# Patient Record
Sex: Male | Born: 1985 | Hispanic: No | Marital: Single
Health system: Midwestern US, Academic
[De-identification: ages and names within clinical notes are randomized; demographics above are authoritative.]

---

## 2018-08-04 ENCOUNTER — Encounter: Admit: 2018-08-04 | Discharge: 2018-08-05

## 2018-08-04 ENCOUNTER — Encounter: Admit: 2018-08-04 | Discharge: 2018-08-04

## 2018-08-04 ENCOUNTER — Encounter: Admit: 2018-08-04 | Discharge: 2018-08-04 | Attending: Trauma Surgery | Admitting: Trauma Surgery

## 2018-08-04 ENCOUNTER — Ambulatory Visit
Admit: 2018-08-04 | Discharge: 2018-08-07 | Disposition: A | Discharge status: Rehabilitation Facility | Source: Other Acute Inpatient Hospital | Attending: Trauma Surgery | Admitting: Trauma Surgery

## 2018-08-04 DIAGNOSIS — S72112A Displaced fracture of greater trochanter of left femur, initial encounter for closed fracture: Principal | ICD-10-CM

## 2018-08-04 DIAGNOSIS — T1490XA Injury, unspecified, initial encounter: Secondary | ICD-10-CM

## 2018-08-04 DIAGNOSIS — R000 Tachycardia, unspecified: Secondary | ICD-10-CM

## 2018-08-04 DIAGNOSIS — F17213 Nicotine dependence, cigarettes, with withdrawal: Secondary | ICD-10-CM

## 2018-08-04 DIAGNOSIS — Z7409 Other reduced mobility: Secondary | ICD-10-CM

## 2018-08-04 DIAGNOSIS — I10 Essential (primary) hypertension: Secondary | ICD-10-CM

## 2018-08-04 DIAGNOSIS — D72829 Elevated white blood cell count, unspecified: Secondary | ICD-10-CM

## 2018-08-04 DIAGNOSIS — K929 Disease of digestive system, unspecified: Secondary | ICD-10-CM

## 2018-08-04 DIAGNOSIS — S72362A Displaced segmental fracture of shaft of left femur, initial encounter for closed fracture: Secondary | ICD-10-CM

## 2018-08-04 DIAGNOSIS — D62 Acute posthemorrhagic anemia: Secondary | ICD-10-CM

## 2018-08-04 LAB — CBC
Lab: 12 % (ref >60)
Lab: 181 K/UL (ref >60)
Lab: 3.7 M/UL — ABNORMAL LOW (ref 4.4–5.5)
Lab: 32 pg (ref 26–34)
Lab: 33 % — ABNORMAL LOW (ref 40–50)
Lab: 35 g/dL (ref 32.0–36.0)
Lab: 8.5 FL (ref 7–11)
Lab: 9.7 K/UL (ref 4.5–11.0)
Lab: 12 % (ref 11–15)
Lab: 17 10*3/uL — ABNORMAL HIGH (ref 4.5–11.0)
Lab: 184 10*3/uL (ref 150–400)
Lab: 2.9 M/UL — ABNORMAL LOW (ref 4.4–5.5)
Lab: 27 % — ABNORMAL LOW (ref 40–50)
Lab: 32 pg (ref 26–34)
Lab: 8.9 FL (ref 7–11)
Lab: 9.6 g/dL — ABNORMAL LOW (ref 13.5–16.5)
Lab: 93 FL (ref 80–100)

## 2018-08-04 LAB — PHOSPHORUS: Lab: 3.2 mg/dL (ref 2.0–4.5)

## 2018-08-04 LAB — COVID-19 (SARS-COV-2) PCR

## 2018-08-04 LAB — BASIC METABOLIC PANEL
Lab: 138 MMOL/L (ref 137–147)
Lab: 3.9 MMOL/L (ref 3.5–5.1)

## 2018-08-04 LAB — MAGNESIUM: Lab: 2 mg/dL — ABNORMAL LOW (ref 1.6–2.6)

## 2018-08-04 LAB — HEMOGLOBIN & HEMATOCRIT
Lab: 11 g/dL — ABNORMAL LOW (ref 13.5–16.5)
Lab: 32 % — ABNORMAL LOW (ref 40–50)

## 2018-08-04 MED ORDER — SENNOSIDES-DOCUSATE SODIUM 8.6-50 MG PO TAB
1 | Freq: Two times a day (BID) | ORAL | 0 refills | Status: DC
Start: 2018-08-04 — End: 2018-08-07
  Administered 2018-08-05 – 2018-08-07 (×4): 1 via ORAL

## 2018-08-04 MED ORDER — BUPIVACAINE 0.125% PCA EPIDURAL SYR
EPIDURAL | 0 refills | Status: DC
Start: 2018-08-04 — End: 2018-08-05
  Administered 2018-08-05 (×4): 50.000 mL via EPIDURAL

## 2018-08-04 MED ORDER — ALBUMIN, HUMAN 5 % 500 ML IV SOLP (AN)(OSM)
0 refills | Status: DC
Start: 2018-08-04 — End: 2018-08-04
  Administered 2018-08-04: 19:00:00 via INTRAVENOUS

## 2018-08-04 MED ORDER — ACETAMINOPHEN 325 MG PO TAB
650 mg | ORAL | 0 refills | Status: DC
Start: 2018-08-04 — End: 2018-08-06
  Administered 2018-08-05 – 2018-08-06 (×5): 650 mg via ORAL

## 2018-08-04 MED ORDER — CEFAZOLIN 1 GRAM IJ SOLR
0 refills | Status: DC
Start: 2018-08-04 — End: 2018-08-04
  Administered 2018-08-04 (×2): 2 g via INTRAVENOUS

## 2018-08-04 MED ORDER — SUGAMMADEX 100 MG/ML IV SOLN
INTRAVENOUS | 0 refills | Status: DC
Start: 2018-08-04 — End: 2018-08-04
  Administered 2018-08-04: 23:00:00 200 mg via INTRAVENOUS

## 2018-08-04 MED ORDER — LIDOCAINE (PF) 10 MG/ML (1 %) IJ SOLN
INTRAMUSCULAR | 0 refills | Status: DC
Start: 2018-08-04 — End: 2018-08-05
  Administered 2018-08-04: 3 mL via INTRAMUSCULAR

## 2018-08-04 MED ORDER — VANCOMYCIN 1,000 MG IV SOLR
0 refills | Status: DC
Start: 2018-08-04 — End: 2018-08-04
  Administered 2018-08-04: 22:00:00 1 g via TOPICAL

## 2018-08-04 MED ORDER — HALOPERIDOL LACTATE 5 MG/ML IJ SOLN
1 mg | Freq: Once | INTRAVENOUS | 0 refills | Status: DC | PRN
Start: 2018-08-04 — End: 2018-08-05

## 2018-08-04 MED ORDER — DEXMEDETOMIDINE# 4MCG/ML IV SOLN
0 refills | Status: DC
Start: 2018-08-04 — End: 2018-08-04
  Administered 2018-08-04: 23:00:00 4 ug via INTRAVENOUS
  Administered 2018-08-04 (×2): 8 ug via INTRAVENOUS

## 2018-08-04 MED ORDER — ONDANSETRON HCL (PF) 4 MG/2 ML IJ SOLN
4 mg | Freq: Once | INTRAVENOUS | 0 refills | Status: CP | PRN
Start: 2018-08-04 — End: ?
  Administered 2018-08-04: 23:00:00 4 mg via INTRAVENOUS

## 2018-08-04 MED ORDER — FENTANYL CITRATE (PF) 50 MCG/ML IJ SOLN
25-50 ug | INTRAVENOUS | 0 refills | Status: DC | PRN
Start: 2018-08-04 — End: 2018-08-05
  Administered 2018-08-04 (×2): 50 ug via INTRAVENOUS
  Administered 2018-08-04: 18:00:00 100 ug via INTRAVENOUS
  Administered 2018-08-04 (×3): 50 ug via INTRAVENOUS

## 2018-08-04 MED ORDER — METHOCARBAMOL 750 MG PO TAB
750 mg | Freq: Two times a day (BID) | ORAL | 0 refills | Status: DC
Start: 2018-08-04 — End: 2018-08-05
  Administered 2018-08-05: 13:00:00 750 mg via ORAL

## 2018-08-04 MED ORDER — CEFAZOLIN INJ 1GM IVP
2 g | INTRAVENOUS | 0 refills | Status: CP
Start: 2018-08-04 — End: ?
  Administered 2018-08-05 (×2): 2 g via INTRAVENOUS

## 2018-08-04 MED ORDER — ROCURONIUM 10 MG/ML IV SOLN
INTRAVENOUS | 0 refills | Status: DC
Start: 2018-08-04 — End: 2018-08-04
  Administered 2018-08-04: 20:00:00 10 mg via INTRAVENOUS
  Administered 2018-08-04: 18:00:00 40 mg via INTRAVENOUS
  Administered 2018-08-04: 19:00:00 10 mg via INTRAVENOUS
  Administered 2018-08-04: 20:00:00 30 mg via INTRAVENOUS

## 2018-08-04 MED ORDER — ENOXAPARIN 30 MG/0.3 ML SC SYRG
30 mg | Freq: Two times a day (BID) | SUBCUTANEOUS | 0 refills | Status: DC
Start: 2018-08-04 — End: 2018-08-05

## 2018-08-04 MED ORDER — OXYCODONE 5 MG PO TAB
5-10 mg | ORAL | 0 refills | Status: DC | PRN
Start: 2018-08-04 — End: 2018-08-05
  Administered 2018-08-05: 09:00:00 5 mg via ORAL

## 2018-08-04 MED ORDER — LACTATED RINGERS IV SOLP
1000 mL | INTRAVENOUS | 0 refills | Status: CP
Start: 2018-08-04 — End: ?
  Administered 2018-08-05: 1000 mL via INTRAVENOUS

## 2018-08-04 MED ORDER — KETAMINE 10 MG/ML IJ SOLN
INTRAVENOUS | 0 refills | Status: DC
Start: 2018-08-04 — End: 2018-08-05
  Administered 2018-08-04: 20 mg via INTRAVENOUS

## 2018-08-04 MED ORDER — AUTOLOGOUS BLOOD (CELL SAVER)
0 refills | Status: DC
Start: 2018-08-04 — End: 2018-08-04

## 2018-08-04 MED ORDER — OXYCODONE 5 MG PO TAB
5-10 mg | Freq: Once | ORAL | 0 refills | Status: DC | PRN
Start: 2018-08-04 — End: 2018-08-05

## 2018-08-04 MED ORDER — SUCCINYLCHOLINE CHLORIDE 20 MG/ML IJ SOLN
INTRAVENOUS | 0 refills | Status: DC
Start: 2018-08-04 — End: 2018-08-04
  Administered 2018-08-04: 18:00:00 200 mg via INTRAVENOUS

## 2018-08-04 MED ORDER — DEXAMETHASONE SODIUM PHOSPHATE 4 MG/ML IJ SOLN
INTRAVENOUS | 0 refills | Status: DC
Start: 2018-08-04 — End: 2018-08-04
  Administered 2018-08-04: 18:00:00 4 mg via INTRAVENOUS

## 2018-08-04 MED ORDER — DEXTRAN 70-HYPROMELLOSE (PF) 0.1-0.3 % OP DPET
0 refills | Status: DC
Start: 2018-08-04 — End: 2018-08-04
  Administered 2018-08-04: 18:00:00 2 [drp] via OPHTHALMIC

## 2018-08-04 MED ORDER — POLYETHYLENE GLYCOL 3350 17 GRAM PO PWPK
1 | Freq: Every day | ORAL | 0 refills | Status: DC
Start: 2018-08-04 — End: 2018-08-07
  Administered 2018-08-05 – 2018-08-07 (×3): 17 g via ORAL

## 2018-08-04 MED ORDER — ONDANSETRON HCL (PF) 4 MG/2 ML IJ SOLN
4-8 mg | INTRAVENOUS | 0 refills | Status: DC | PRN
Start: 2018-08-04 — End: 2018-08-07

## 2018-08-04 MED ORDER — HYDROMORPHONE (PF) 2 MG/ML IJ SYRG
.5-1 mg | INTRAVENOUS | 0 refills | Status: DC | PRN
Start: 2018-08-04 — End: 2018-08-05
  Administered 2018-08-04: 19:00:00 1 mg via INTRAVENOUS
  Administered 2018-08-04 (×2): 0.5 mg via INTRAVENOUS
  Administered 2018-08-04: 22:00:00 1 mg via INTRAVENOUS
  Administered 2018-08-04 (×2): 0.5 mg via INTRAVENOUS

## 2018-08-04 MED ORDER — FENTANYL CITRATE (PF) 50 MCG/ML IJ SOLN
25 ug | INTRAVENOUS | 0 refills | Status: DC | PRN
Start: 2018-08-04 — End: 2018-08-05

## 2018-08-04 MED ORDER — PROPOFOL INJ 10 MG/ML IV VIAL
0 refills | Status: DC
Start: 2018-08-04 — End: 2018-08-04
  Administered 2018-08-04: 18:00:00 200 mg via INTRAVENOUS

## 2018-08-04 MED ORDER — LIDOCAINE (PF) 200 MG/10 ML (2 %) IJ SYRG
0 refills | Status: DC
Start: 2018-08-04 — End: 2018-08-04
  Administered 2018-08-04: 18:00:00 100 mg via INTRAVENOUS

## 2018-08-04 MED ORDER — NALOXONE 0.4 MG/ML IJ SOLN
.08 mg | INTRAVENOUS | 0 refills | Status: DC | PRN
Start: 2018-08-04 — End: 2018-08-07

## 2018-08-04 MED ORDER — DIPHENHYDRAMINE HCL 50 MG/ML IJ SOLN
25 mg | Freq: Once | INTRAVENOUS | 0 refills | Status: DC | PRN
Start: 2018-08-04 — End: 2018-08-05

## 2018-08-04 MED ORDER — KETAMINE 10 MG/ML IJ SOLN
0 refills | Status: DC
Start: 2018-08-04 — End: 2018-08-04
  Administered 2018-08-04 (×4): 15 mg via INTRAVENOUS

## 2018-08-04 MED ORDER — MIDAZOLAM 1 MG/ML IJ SOLN
INTRAVENOUS | 0 refills | Status: DC
Start: 2018-08-04 — End: 2018-08-04
  Administered 2018-08-04: 18:00:00 2 mg via INTRAVENOUS

## 2018-08-04 MED ORDER — LACTATED RINGERS IV SOLP
1000 mL | INTRAVENOUS | 0 refills | Status: DC
Start: 2018-08-04 — End: 2018-08-05
  Administered 2018-08-04: 14:00:00 1000.000 mL via INTRAVENOUS
  Administered 2018-08-04: 16:00:00 1000 mL via INTRAVENOUS
  Administered 2018-08-04: 20:00:00 1000.000 mL via INTRAVENOUS

## 2018-08-04 MED ORDER — FENTANYL CITRATE (PF) 50 MCG/ML IJ SOLN
50 ug | INTRAVENOUS | 0 refills | Status: DC | PRN
Start: 2018-08-04 — End: 2018-08-05

## 2018-08-04 MED ORDER — LIDOCAINE-EPINEPHRINE (PF) 1.5 %-1:200,000 IJ SOLN (OR)
0 refills | Status: DC
Start: 2018-08-04 — End: 2018-08-05

## 2018-08-04 NOTE — Progress Notes
Brief Ortho Note    Plan for OR today with orthopedics.  Will order CT scan of left femur to be done prior to OR.  Full consult note to follow.  Keep NPO.    Paul [name]  2222

## 2018-08-04 NOTE — Progress Notes
PHYSICAL THERAPY  NOTE      Name: Paul [name]        MRN: [id number]          DOB: Mar 20, 1985          Age: 33 y.o.  Admission Date: 08/04/2018             LOS: 0 days      PT order received. Patient is in [place] today with ortho for L femur fx. PT will f/u tomorrow, Monday 6/29, pending MD post-op instructions and clearance to mobilize.  Thank you.    Therapist: Kathlee Nations, MPT  Date: 08/04/2018
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
## 2018-08-04 NOTE — Other
Brief Operative Note    Name: Paul [name] is a 33 y.o. male     DOB: June 06, 1985             MRN#: [id number]  DATE OF OPERATION: 08/04/2018    Date:  08/04/2018        Preoperative Dx:   Trauma [T14.90XA]    Post-op Diagnosis      * Trauma [T14.90XA]    Procedure(s) (LRB):  SURGICAL STABILIZATION WITH  INTERNAL FIXATION OF LEFT FEMORAL FRACTURE AND WOUND VAC APPLICATION (Left)    Anesthesia Type: Defer to Anesthesia    Surgeon(s) and Role:     * Heddings, Revonda Standard, MD - Primary     * Laveda Norman, MD - Resident - Assisting     * Theressa Millard, Hinda Kehr., MD - Resident - Assisting      Findings:  L femur fx    Estimated Blood Loss: 2000 ml    Specimen(s) Removed/Disposition: * No specimens in log *    Complications:  None    Implants:   Implant Name Type Inv. Item Serial No. Manufacturer Lot No. LRB No. Used   SCREW BONE 4.5MM LCP STAINLESS STEEL STANDARD CORTICAL - SNA  SCREW BONE 4.5MM LCP STAINLESS STEEL STANDARD CORTICAL NA DEPUY SYNTHES CO NA Left 1   SCREW BONE 4.5MM LCP STAINLESS STEEL PERIARTICULAR - SNA  SCREW BONE 4.5MM LCP STAINLESS STEEL PERIARTICULAR NA DEPUY SYNTHES CO NA Left 1   SCREW BONE 12.7MM DHS/DCS STAINLESS STEEL HIP CONDYLE - SNA  SCREW BONE 12.7MM DHS/DCS STAINLESS STEEL HIP CONDYLE NA DEPUY SYNTHES CO NA Left 1   PLATE 454U STANDARD BARREL STAINLESS STEEL 4.5MM - SNA  PLATE 981X STANDARD BARREL STAINLESS STEEL 4.5MM NA DEPUY SYNTHES CO NA Left 1   TI CANNULATED RETROGRADE FEMORAL NAIL-EX/280MM-STERILE   NA SYNTHES SYNTHES [place] [id number] Left 1   SCREW BONE   TITANIUM FULL THREAD FEMUR BLUNT TIP - SNA  SCREW BONE   TITANIUM FULL THREAD FEMUR BLUNT TIP NA DEPUY SYNTHES [employer] NA Left 1   SCREW BONE   TITANIUM 3.5MM FULL THREAD SELF TAP - SNA  SCREW BONE   TITANIUM 3.5MM FULL THREAD SELF TAP NA DEPUY SYNTHES CO NA Left 1   SYNTHES SCREW   NA  NA Left 1         Drains: incisional WV Disposition:  PACU - stable    Laveda Norman, MD  Pager
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
## 2018-08-04 NOTE — Progress Notes
Ortho Post-op Recs    60M w/ L segmental femur fx s/p ORIF 6/28    -TTWB LLE  -no active hip abduction for 6 weeks  -may start DVT ppx 6/29  -maintain incisional vac, keep dressings/incision clean and dry  -may have diet from ortho standpoint    Martie Round, MD    Please page Nicoletta Dress with ortho trauma during normal business hours; otherwise page the orthopedic resident on call

## 2018-08-04 NOTE — Progress Notes
Patient arrived to room # [phone]*) via bed accompanied by transport. Patient transferred to the bed with assistance. Bedside safety checks completed. Initial patient assessment completed. Refer to flowsheet for details.    Admission skin assessment completed with: Elberta Fortis, RN    Pressure injury present on arrival?: No    1. Head/Face/Neck: No  2. Trunk/Back: No  3. Upper Extremities: Yes  4. Lower Extremities: No  5. Pelvic/Coccyx: No  6. Assessed for device associated injury? Yes  7. Malnutrition Screening Tool (Nursing Nutrition Assessment) Completed? Yes    See Doc Flowsheet for additional wound details.     INTERVENTIONS:     Patients back and coccyx area not assessed.  Pt has immobilizer on left leg and this RN unsure if patient is able to be turned onto right side without pain/dislocating/dislodging any fractures that are currently on the left side.

## 2018-08-05 DIAGNOSIS — S72112A Displaced fracture of greater trochanter of left femur, initial encounter for closed fracture: Principal | ICD-10-CM

## 2018-08-05 LAB — PHOSPHORUS: Lab: 3.5 mg/dL — ABNORMAL LOW (ref 2.0–4.5)

## 2018-08-05 LAB — CBC
Lab: 10 10*3/uL (ref 4.5–11.0)
Lab: 106 10*3/uL — ABNORMAL LOW (ref 150–400)
Lab: 11 10*3/uL (ref 4.5–11.0)
Lab: 13 % (ref 11–15)
Lab: 2.3 M/UL — ABNORMAL LOW (ref 4.4–5.5)
Lab: 21 % — ABNORMAL LOW (ref 40–50)
Lab: 31 pg (ref 26–34)
Lab: 34 g/dL (ref 32.0–36.0)
Lab: 7.3 g/dL — ABNORMAL LOW (ref 13.5–16.5)
Lab: 8.5 FL (ref 7–11)
Lab: 89 FL (ref 80–100)

## 2018-08-05 LAB — BASIC METABOLIC PANEL
Lab: 106 MMOL/L — ABNORMAL LOW (ref 98–110)
Lab: 135 MMOL/L — ABNORMAL LOW (ref 137–147)
Lab: 135 MMOL/L — ABNORMAL LOW (ref 137–147)

## 2018-08-05 LAB — MAGNESIUM: Lab: 1.7 mg/dL — ABNORMAL HIGH (ref >60)

## 2018-08-05 MED ORDER — ENOXAPARIN 40 MG/0.4 ML SC SYRG
40 mg | Freq: Every day | SUBCUTANEOUS | 0 refills | Status: DC
Start: 2018-08-05 — End: 2018-08-05

## 2018-08-05 MED ORDER — FOLIC ACID 1 MG PO TAB
1 mg | Freq: Every day | ORAL | 0 refills | Status: DC
Start: 2018-08-05 — End: 2018-08-07
  Administered 2018-08-05 – 2018-08-07 (×3): 1 mg via ORAL

## 2018-08-05 MED ORDER — MAGNESIUM SULFATE IN D5W 1 GRAM/100 ML IV PGBK
1 g | INTRAVENOUS | 0 refills | Status: CP
Start: 2018-08-05 — End: ?
  Administered 2018-08-05 (×2): 1 g via INTRAVENOUS

## 2018-08-05 MED ORDER — GABAPENTIN 300 MG PO CAP
900 mg | Freq: Three times a day (TID) | ORAL | 0 refills | Status: DC
Start: 2018-08-05 — End: 2018-08-07
  Administered 2018-08-06 – 2018-08-07 (×5): 900 mg via ORAL

## 2018-08-05 MED ORDER — GABAPENTIN 300 MG PO CAP
600 mg | Freq: Three times a day (TID) | ORAL | 0 refills | Status: DC
Start: 2018-08-05 — End: 2018-08-07

## 2018-08-05 MED ORDER — HYDROMORPHONE (PF) 2 MG/ML IJ SYRG
1 mg | INTRAVENOUS | 0 refills | Status: DC | PRN
Start: 2018-08-05 — End: 2018-08-06
  Administered 2018-08-06: 09:00:00 1 mg via INTRAVENOUS

## 2018-08-05 MED ORDER — HYDROMORPHONE (PF) 2 MG/ML IJ SYRG
.5-1 mg | Freq: Once | INTRAVENOUS | 0 refills | Status: CP
Start: 2018-08-05 — End: ?
  Administered 2018-08-05: 18:00:00 1 mg via INTRAVENOUS

## 2018-08-05 MED ORDER — CALCIUM CARBONATE 200 MG CALCIUM (500 MG) PO CHEW
500 mg | ORAL | 0 refills | Status: DC | PRN
Start: 2018-08-05 — End: 2018-08-06
  Administered 2018-08-06: 14:00:00 500 mg via ORAL

## 2018-08-05 MED ORDER — NICOTINE 14 MG/24 HR TD PT24
1 | Freq: Every day | TRANSDERMAL | 0 refills | Status: DC
Start: 2018-08-05 — End: 2018-08-07
  Administered 2018-08-05 – 2018-08-07 (×3): 1 via TRANSDERMAL

## 2018-08-05 MED ORDER — GABAPENTIN 400 MG PO CAP
800 mg | Freq: Three times a day (TID) | ORAL | 0 refills | Status: CP
Start: 2018-08-05 — End: ?
  Administered 2018-08-05 – 2018-08-06 (×3): 800 mg via ORAL

## 2018-08-05 MED ORDER — HYDROXYZINE HCL 25 MG PO TAB
25 mg | Freq: Three times a day (TID) | ORAL | 0 refills | Status: DC | PRN
Start: 2018-08-05 — End: 2018-08-06
  Administered 2018-08-05 – 2018-08-06 (×2): 25 mg via ORAL

## 2018-08-05 MED ORDER — MELATONIN 5 MG PO TAB
5 mg | Freq: Every evening | ORAL | 0 refills | Status: DC
Start: 2018-08-05 — End: 2018-08-05

## 2018-08-05 MED ORDER — METHOCARBAMOL 750 MG PO TAB
750 mg | Freq: Three times a day (TID) | ORAL | 0 refills | Status: DC
Start: 2018-08-05 — End: 2018-08-05
  Administered 2018-08-05: 19:00:00 750 mg via ORAL

## 2018-08-05 MED ORDER — THIAMINE MONONITRATE (VIT B1) 100 MG PO TAB
100 mg | Freq: Every day | ORAL | 0 refills | Status: DC
Start: 2018-08-05 — End: 2018-08-07
  Administered 2018-08-05 – 2018-08-07 (×3): 100 mg via ORAL

## 2018-08-05 MED ORDER — ENOXAPARIN 30 MG/0.3 ML SC SYRG
30 mg | Freq: Two times a day (BID) | SUBCUTANEOUS | 0 refills | Status: DC
Start: 2018-08-05 — End: 2018-08-07
  Administered 2018-08-06 – 2018-08-07 (×4): 30 mg via SUBCUTANEOUS

## 2018-08-05 MED ORDER — CYCLOBENZAPRINE 10 MG PO TAB
10 mg | ORAL | 0 refills | Status: DC | PRN
Start: 2018-08-05 — End: 2018-08-05
  Administered 2018-08-05: 20:00:00 10 mg via ORAL

## 2018-08-05 MED ORDER — GABAPENTIN 300 MG PO CAP
300 mg | Freq: Three times a day (TID) | ORAL | 0 refills | Status: DC
Start: 2018-08-05 — End: 2018-08-07

## 2018-08-05 MED ORDER — OXYCODONE 5 MG PO TAB
5-15 mg | ORAL | 0 refills | Status: DC | PRN
Start: 2018-08-05 — End: 2018-08-06
  Administered 2018-08-05 – 2018-08-06 (×6): 15 mg via ORAL

## 2018-08-05 MED ORDER — GABAPENTIN 100 MG PO CAP
200 mg | ORAL | 0 refills | Status: DC
Start: 2018-08-05 — End: 2018-08-05
  Administered 2018-08-05: 13:00:00 200 mg via ORAL

## 2018-08-05 MED ORDER — CYCLOBENZAPRINE 10 MG PO TAB
10 mg | Freq: Three times a day (TID) | ORAL | 0 refills | Status: DC
Start: 2018-08-05 — End: 2018-08-07
  Administered 2018-08-06 – 2018-08-07 (×6): 10 mg via ORAL

## 2018-08-05 MED ORDER — SODIUM CHLORIDE 0.9 % IV SOLP
1000 mL | INTRAVENOUS | 0 refills | Status: CP
Start: 2018-08-05 — End: ?
  Administered 2018-08-05: 13:00:00 1000 mL via INTRAVENOUS

## 2018-08-05 MED ORDER — MELATONIN 5 MG PO TAB
10 mg | Freq: Every evening | ORAL | 0 refills | Status: DC
Start: 2018-08-05 — End: 2018-08-07
  Administered 2018-08-06 – 2018-08-07 (×2): 10 mg via ORAL

## 2018-08-05 MED ORDER — ALBUTEROL SULFATE 90 MCG/ACTUATION IN HFAA
2 | RESPIRATORY_TRACT | 0 refills | Status: DC | PRN
Start: 2018-08-05 — End: 2018-08-05

## 2018-08-05 MED ORDER — PATCH DOCUMENTATION - NICOTINE 14 MG/24HR
Freq: Two times a day (BID) | TRANSDERMAL | 0 refills | Status: DC
Start: 2018-08-05 — End: 2018-08-07

## 2018-08-05 MED ORDER — FENTANYL CITRATE (PF) 50 MCG/ML IJ SOLN
25 ug | Freq: Once | INTRAVENOUS | 0 refills | Status: CP
Start: 2018-08-05 — End: ?
  Administered 2018-08-05: 17:00:00 25 ug via INTRAVENOUS

## 2018-08-05 MED ADMIN — SODIUM CHLORIDE 0.9 % IV SOLP [27838]: INTRAVENOUS | @ 03:00:00 | Stop: 2018-08-05 | NDC 00338004902

## 2018-08-05 NOTE — Progress Notes
Brief Ortho Update:      Was told that patient is in more pain after epidural cath was removed earlier today. At bedside, that patient stated that he was doing fine after epidural cath removal until he worked with PT. He said that when his L leg was hanging off bed, he felt as though his bone "was moving". Since then, he complains of intermittent muscle spasms that are very painful. Without spasms, he believes his pain is being controlled with current medications. With this in mind, Flexeril has been added to regimen. Also, L femur films has been ordered to rule out hardware failure or fracture displacement. The patient agreed to continue current pain regimen with the addition of flexeril. If medications do not improve patient's pain, acute pain service can be consulted for better management.       Barnet Glasgow  [phone]

## 2018-08-05 NOTE — Progress Notes
Patient arrived to room # [phone]) via bed accompanied by transport.  Bedside safety checks completed. Initial patient assessment completed. Refer to flowsheet for details.    Admission skin assessment completed with: Celiana, RN    Pressure injury present on arrival?: No    1. Head/Face/Neck: No  2. Trunk/Back: No  3. Upper Extremities: No  4. Lower Extremities: No  5. Pelvic/Coccyx: No  6. Assessed for device associated injury? Yes  7. Malnutrition Screening Tool (Nursing Nutrition Assessment) Completed? Yes    See Doc Flowsheet for additional wound details.     INTERVENTIONS:
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
## 2018-08-05 NOTE — Progress Notes
RT Adult Assessment Note    NAME:Jhalen Weedon             MRN: [id number]             DOB:[birth year]          AGE: 33 y.o.  ADMISSION DATE: 08/04/2018             DAYS ADMITTED: LOS: 1 day    RT Treatment Plan:  Protocol Plan: Medications  Albuterol: MDI PRN    Protocol Plan: Procedures  PAP: Place a nursing order for "IS Q1h While Awake" for any of Lung Expansion indicators  Oxygen/Humidity: O2 to keep SpO2 > 92%, if not on any RT modality, D/C protocol if greater than 24 hours on room air  SpO2: BID & PRN    Additional Comments:  Impressions of the patient: pt resting, no respiratory distress  Intervention(s)/outcome(s): rt eval completed  Patient education that was completed: is with nursing  Recommendations to the care team: n/a    Vital Signs:  Pulse: (!) 134  RR: 18 PER MINUTE  SpO2: 100 %  O2 Device: Cannula  Liter Flow: 1 Lpm  O2%:    Breath Sounds: Clear (Implies normal)  Respiratory Effort: Non-Labored

## 2018-08-05 NOTE — Anesthesia Pain Rounding
Anesthesia Follow-Up Evaluation: Post-Procedure Day One    Name: Paul [name]     MRN: [id number]     DOB: 04-Aug-1985     Age: 33 y.o.     Sex: male   __________________________________________________________________________     Procedure Date: 08/04/2018   Procedure: Procedure(s):  SURGICAL STABILIZATION WITH  INTERNAL FIXATION OF LEFT FEMORAL FRACTURE AND WOUND VAC APPLICATION    Physical Assessment  Height: 162.6 cm (64)  Weight: 99.8 kg (220 lb)    Vital Signs (Last Filed in 24 hours)  BP: 120/70 (06/29 0735)  Temp: 36.8 ???C (98.3 ???F) (06/29 0735)  Pulse: 135 (06/29 0735)  Respirations: 20 PER MINUTE (06/29 0735)  SpO2: 99 % (06/29 0735)  SpO2 Pulse: 133 (06/28 2300)  Height: 162.6 cm (64) (06/29 0100)    Patient History   Allergies  Allergies   Allergen Reactions   ??? Aloe Vera RASH   ??? Macadamia Nut Oil RASH   ??? Poison Ivy EDEMA   ??? Vitamin E RASH        Medications  Scheduled Meds:acetaminophen (TYLENOL) tablet 650 mg, 650 mg, Oral, Q6H*  enoxaparin (LOVENOX) syringe 40 mg, 40 mg, Subcutaneous, QDAY(21)  folic acid (FOLVITE) tablet 1 mg, 1 mg, Oral, QDAY  [START ON 08/11/2018] gabapentin (NEURONTIN) capsule 300 mg, 300 mg, Oral, TID  [START ON 08/09/2018] gabapentin (NEURONTIN) capsule 600 mg, 600 mg, Oral, TID  gabapentin (NEURONTIN) capsule 800 mg, 800 mg, Oral, TID  [START ON 08/06/2018] gabapentin (NEURONTIN) capsule 900 mg, 900 mg, Oral, TID  melatonin tablet 10 mg, 10 mg, Oral, QHS  methocarbamoL (ROBAXIN) tablet 750 mg, 750 mg, Oral, BID  nicotine (NICODERM CQ STEP 2) 14 mg/day patch 1 patch, 1 patch, Transdermal, QDAY    And  Verification of Patch Placement and Integrity - Nicotine 14 MG/24HR, , Transdermal, BID  polyethylene glycol 3350 (MIRALAX) packet 17 g, 1 packet, Oral, QDAY  senna/docusate (SENOKOT-S) tablet 1 tablet, 1 tablet, Oral, BID  thiamine (VITAMIN B-1) tablet 100 mg, 100 mg, Oral, QDAY    Continuous Infusions:  ??? bupivacaine PCA 0.125% in NS 50mL epidural infusion syr PRN and Respiratory Meds:albuterol sulfate Q4H PRN, calcium carbonate Q6H PRN, hydrOXYzine TID PRN, naloxone PRN, ondansetron (ZOFRAN) IV Q6H PRN, oxyCODONE Q3H PRN      Diagnostic Tests  Hematology:   Lab Results   Component Value Date    HGB 8.3 08/05/2018    HCT 23.7 08/05/2018    PLTCT 113 08/05/2018    WBC 10.4 08/05/2018    MCV 91.3 08/05/2018    MCH 32.2 08/05/2018    MCHC 35.2 08/05/2018    MPV 9.1 08/05/2018    RDW 13.3 08/05/2018         General Chemistry:   Lab Results   Component Value Date    NA 135 08/05/2018    K 4.5 08/05/2018    CL 106 08/05/2018    CO2 22 08/05/2018    GAP 7 08/05/2018    BUN 22 08/05/2018    CR 1.32 08/05/2018    GLU 148 08/05/2018    CA 7.5 08/05/2018    MG 1.7 08/05/2018    PO4 3.5 08/05/2018      Coagulation: No results found for: PT, PTT, INR      Follow-Up Assessment  Patient location during evaluation: floor      Anesthetic Complications:   Anesthetic complications: The patient did not experience any anesthestic complications.      Pain:  Score: 2  Management:adequate     Level of Consciousness: awake and alert   Hydration:acceptable     Airway Patency: patent   Respiratory Status: acceptable and room air     Cardiovascular Status:acceptable and stable   Regional/Neuroaxial:       Epidural in place             Comments: Pain well-controlled at time of assessment. Epidural in place; continuous infusion and PCA. Vital signs stable. Nutrition adequate.

## 2018-08-05 NOTE — Progress Notes
Anesthesiology Acute Pain Service     Date of Service: 08/05/2018    Name: Paul [name] is a 33 y.o. male     DOB: 1985/08/07             MRN#: [id number]      PROCEDURE: Procedure(s):  SURGICAL STABILIZATION WITH  INTERNAL FIXATION OF LEFT FEMORAL FRACTURE AND WOUND VAC APPLICATION                 POD #: 1    ANALGESIA TECHNIQUE   Epidural catheter: bupivacaine 0.125%  Bolus: 4 mL  Delay: 20 minutes  Basal Infusion: 6 mL/hr    ADJUNCT ANALGESIA MEDICATIONS  fentanyl  oxycodone  acetaminophen PO     TREATMENT PLAN  Ortho requested epidural catheter to be removed.   Catheter pulled. Blue Tip intact.   Lovenox Rescheduled for 2100 tonight.     Primary team to manage pain.     Please page for any concerns.   Anesthesia Pain pager: [phone]  ______________________________________________________________________    Allergies   Allergen Reactions   ??? Aloe Vera RASH   ??? Macadamia Nut Oil RASH   ??? Poison Ivy EDEMA   ??? Vitamin E RASH       Inpatient Medications  Scheduled Meds:acetaminophen (TYLENOL) tablet 650 mg, 650 mg, Oral, Q6H*  ceFAZolin (ANCEF) IVP 2 g, 2 g, Intravenous, Q8H*  enoxaparin (LOVENOX) syringe 30 mg, 30 mg, Subcutaneous, BID  methocarbamoL (ROBAXIN) tablet 750 mg, 750 mg, Oral, BID  polyethylene glycol 3350 (MIRALAX) packet 17 g, 1 packet, Oral, QDAY  senna/docusate (SENOKOT-S) tablet 1 tablet, 1 tablet, Oral, BID    Continuous Infusions:  ??? bupivacaine PCA 0.125% in NS 50mL epidural infusion syr     ??? lactated ringers infusion 1,000 mL (08/04/18 1057)     PRN and Respiratory Meds:albuterol sulfate Q4H PRN, fentaNYL citrate PF Q2H PRN, naloxone PRN, ondansetron (ZOFRAN) IV Q6H PRN, oxyCODONE Q4H PRN      Anticoagulants  enoxaparin (LOVENOX) syringe 30 mg  BID    HPI   Visual Analog Scale (VAS)   (0-10 Scale)        At rest: 3       Patient satisfied with pain control: Yes    Side Effects: none      EXAM         Recent Vitals             Vital Signs: 24 Hour Range   BP: 119/70 (06/29 0506) Temp: 36.8 ???C (98.3 ???F) (06/29 0506)  Pulse: 128 (06/29 0506)  Respirations: 18 PER MINUTE (06/29 0506)  SpO2: 97 % (06/29 0506)  SpO2 Pulse: 133 (06/28 2300)  Height: 162.6 cm (64) (06/29 0100) BP: (94-160)/(52-126)   Temp:  [36.4 ???C (97.5 ???F)-37.2 ???C (99 ???F)]   Pulse:  [107-163]   Respirations:  [11 PER MINUTE-33 PER MINUTE]   SpO2:  [90 %-100 %]      Lab Results   Component Value Date    PLTCT 113 08/05/2018    WBC 10.4 08/05/2018    HGB 8.3 08/05/2018    HCT 23.7 08/05/2018    CR 1.32 08/05/2018       Level of Consciousness: Awake/alert    Neurologic Function        Sensory block: Yes        Motor: No    Insertion Site: Site clean and nontender

## 2018-08-05 NOTE — Progress Notes
RT Adult Assessment Note    NAME:Paul [name]             MRN: [id number]             DOB:[birth year]          AGE: 33 y.o.  ADMISSION DATE: 08/04/2018             DAYS ADMITTED: LOS: 1 day    RT Treatment Plan:  Protocol Plan: Medications  Albuterol: Discontinued    Protocol Plan: Procedures  PAP: Place a nursing order for "IS Q1h While Awake" for any of Lung Expansion indicators  Oxygen/Humidity: O2 to keep SpO2 > 92%, if not on any RT modality, D/C protocol if greater than 24 hours on room air  SpO2: BID & PRN    Additional Comments:  Impressions of the patient: no signs of distress  Intervention(s)/outcome(s): IS done  Patient education that was completed: IS instructed  Recommendations to the care team: none    Vital Signs:  Pulse: (!) 136  RR: 18 PER MINUTE  SpO2: 95 %  O2 Device: Standby  Liter Flow:    O2%:    Breath Sounds: Clear (Implies normal)  Respiratory Effort: Non-Labored

## 2018-08-05 NOTE — Progress Notes
Report given to unit 35 RN.
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
## 2018-08-05 NOTE — Anesthesia Post-Procedure Evaluation
Post-Anesthesia Evaluation    Name: Regan Llorente      MRN: [id number]     DOB: Nov 14, 1985     Age: 33 y.o.     Sex: male   __________________________________________________________________________     Procedure Date: 08/04/2018  Procedure(s) (LRB):  SURGICAL STABILIZATION WITH  INTERNAL FIXATION OF LEFT FEMORAL FRACTURE AND WOUND VAC APPLICATION (Left)      Surgeon: Surgeon(s):  Birlingmair, Telford Nab., MD  Heddings, Norman Herrlich, MD  Martie Round, MD    Post-Anesthesia Vitals  BP: 107/80 (06/28 2210)  Pulse: 129 (06/28 2210)  Respirations: 25 PER MINUTE (06/28 2210)  SpO2: 98 % (06/28 2210)  SpO2 Pulse: 129 (06/28 2210)   Vitals Value Taken Time   BP 107/80 08/04/2018 10:10 PM   Temp 36.7 C (98.1 F) 08/04/2018  9:05 PM   Pulse 129 08/04/2018 10:10 PM   Respirations 25 PER MINUTE 08/04/2018 10:10 PM   SpO2 98 % 08/04/2018 10:10 PM         Post Anesthesia Evaluation Note    Evaluation location: Pre/Post  Patient participation: recovered; patient participated in evaluation  Level of consciousness: alert    Pain score: 0  Pain management: adequate    Hydration: normovolemia  Temperature: 36.0C - 38.4C  Airway patency: adequate    Regional/Neuraxial:       Neurological status: sensory deficit      Epidural in place    Perioperative Events       Post-op nausea and vomiting: no PONV    Postoperative Status  Cardiovascular status: tachycardic and hemodynamically stable (patient with 2L of blood loss; he was given cell saver 400 and 1 unit of PRBCs.  Tachycardia improving)  Respiratory status: spontaneous ventilation  Follow-up needed: none        Perioperative Events  Perioperative Event: No  Emergency Case Activation: No

## 2018-08-05 NOTE — Discharge Instructions - Supplementary Instructions
Prescription Drug Discount Resource  -GoodRx  GoodRx gathers current prices and discounts to help you find the lowest cost pharmacy for your prescriptions. GoodRx is 100% free. No personal information required.  https://www.goodrx.com or go to m.goodrx.com from any mobile phone.     -[place] Residents  As a resident of [place], you and your family have access to a statewide Prescription Assistance Program (PAP)  Https://www.[place]drugcard.com/    -[place] Residents  The [place] Rx Card is a free prescription discount card provided by township, city and county governments  and by local [employer] organizations.  http://www.coast2coastrx.com/
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
## 2018-08-05 NOTE — Care Plan
Problem: Infection, Risk of  Goal: Absence of infection  Outcome: Goal Ongoing  Goal: Knowledge of Infection Control Procedures  Outcome: Goal Ongoing     Problem: Falls, High Risk of  Goal: Absence of falls-Adult Patient  Outcome: Goal Ongoing  Goal: Absence of Falls-Pediatric patient  Outcome: Goal Ongoing     Problem: Infection, Risk of, Urinary Catheter-Associated Urinary Tract Infection  Goal: Absence of urinary catheter-associated infection  Outcome: Goal Ongoing     Problem: Discharge Planning  Goal: Participation in plan of care  Outcome: Goal Ongoing  Goal: Knowledge regarding plan of care  Outcome: Goal Ongoing  Goal: Prepared for discharge  Outcome: Goal Ongoing     Problem: Mobility/Activity Intolerance  Goal: Maximize functional ADL's and mobility outcomes  Outcome: Goal Ongoing     Problem: Self-Care Deficit  Goal: Maximize ADL functioning  Outcome: Goal Ongoing     Problem: Pain  Goal: Management of pain  Outcome: Goal Ongoing  Goal: Knowledge of pain management  Outcome: Goal Ongoing  Goal: Progress Toward Pain Management Goals  Outcome: Goal Ongoing

## 2018-08-05 NOTE — Progress Notes
1200: Patient c/o increased pain after therapy. Patient unavailable for oxycodone. Trauma team paged, one time dose 39mcg of fentanyl added.  1222: Patient states pain is still unrelieved, oxycodone unavailable. 25mg  atarax given.  1305: Patient states "the medications are making me drowsy, but the pain isn't getting any better". Trauma notified.  1312: Patient rating pain 10/10, 15mg  of oxycodone administered. One time dose of dilaudid ordered by trauma.   1327: Dilaudid administered. Patient states pain is still uncontrolled, and has concerns that hardware has shifted during PT/OT. Patient states "the pain is throbbing, and I feel like something is pinching me." Patient requesting new imaging of left leg. Physical exam appears the same as morning assessment, trauma NP at bedside to evaluate patient.   1445: Ortho at bedside to evaluate patient. Flexeril added to [facility], femur films ordered.  1545: Patient asleep.
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
## 2018-08-06 ENCOUNTER — Encounter: Admit: 2018-08-06 | Discharge: 2018-08-06

## 2018-08-06 DIAGNOSIS — K929 Disease of digestive system, unspecified: Secondary | ICD-10-CM

## 2018-08-06 DIAGNOSIS — I10 Essential (primary) hypertension: Secondary | ICD-10-CM

## 2018-08-06 LAB — PHOSPHORUS
Lab: 1.7 mg/dL — ABNORMAL LOW (ref >60)
Lab: 1.9 mg/dL — ABNORMAL LOW (ref 2.0–4.5)

## 2018-08-06 LAB — CBC
Lab: 10 10*3/uL (ref 4.5–11.0)
Lab: 104 10*3/uL — ABNORMAL LOW (ref >60)
Lab: 13 % — ABNORMAL LOW (ref >60)
Lab: 2.2 M/UL — ABNORMAL LOW (ref 4.4–5.5)
Lab: 20 % — ABNORMAL LOW (ref 40–50)
Lab: 31 pg — ABNORMAL HIGH (ref 26–34)
Lab: 34 g/dL (ref 32.0–36.0)
Lab: 7.1 g/dL — ABNORMAL LOW (ref 13.5–16.5)
Lab: 8.4 FL (ref 7–11)
Lab: 89 FL — ABNORMAL HIGH (ref 80–100)
Lab: 9.9 K/UL — ABNORMAL LOW (ref >60)

## 2018-08-06 LAB — IRON + BINDING CAPACITY + %SAT+ FERRITIN
Lab: 11 ug/dL — ABNORMAL LOW (ref 50–185)
Lab: 188 ug/dL — ABNORMAL LOW (ref 270–380)
Lab: 218 ng/mL (ref >60)
Lab: 6 % — ABNORMAL LOW (ref >60)

## 2018-08-06 LAB — BASIC METABOLIC PANEL: Lab: 135 MMOL/L — ABNORMAL LOW (ref >60)

## 2018-08-06 LAB — MAGNESIUM: Lab: 2 mg/dL (ref 1.6–2.6)

## 2018-08-06 MED ORDER — CALCIUM CARBONATE 200 MG CALCIUM (500 MG) PO CHEW
500-1000 mg | ORAL | 0 refills | Status: DC | PRN
Start: 2018-08-06 — End: 2018-08-07
  Administered 2018-08-06: 18:00:00 500 mg via ORAL
  Administered 2018-08-07: 08:00:00 1000 mg via ORAL

## 2018-08-06 MED ORDER — FERROUS GLUCONATE 324 MG (37.5 MG IRON) PO TAB
324 mg | Freq: Every day | ORAL | 0 refills | Status: DC
Start: 2018-08-06 — End: 2018-08-07
  Administered 2018-08-06 – 2018-08-07 (×2): 324 mg via ORAL

## 2018-08-06 MED ORDER — SODIUM PHOSPHATE IVPB
20 MMOL | Freq: Once | INTRAVENOUS | 0 refills | Status: CP
Start: 2018-08-06 — End: ?
  Administered 2018-08-06 (×2): 20 mmol via INTRAVENOUS

## 2018-08-06 MED ORDER — ASCORBIC ACID (VITAMIN C) 500 MG PO TAB
500 mg | Freq: Every day | ORAL | 0 refills | Status: DC
Start: 2018-08-06 — End: 2018-08-07
  Administered 2018-08-06 – 2018-08-07 (×2): 500 mg via ORAL

## 2018-08-06 MED ORDER — ACETAMINOPHEN 500 MG PO TAB
1000 mg | ORAL | 0 refills | Status: DC
Start: 2018-08-06 — End: 2018-08-07
  Administered 2018-08-06 – 2018-08-07 (×5): 1000 mg via ORAL

## 2018-08-06 MED ORDER — MORPHINE 15 MG PO TAB
30 mg | ORAL | 0 refills | Status: DC | PRN
Start: 2018-08-06 — End: 2018-08-07
  Administered 2018-08-07 (×5): 30 mg via ORAL

## 2018-08-06 MED ORDER — TAMSULOSIN 0.4 MG PO CAP
0.4 mg | Freq: Every day | ORAL | 0 refills | Status: DC
Start: 2018-08-06 — End: 2018-08-07
  Administered 2018-08-06 – 2018-08-07 (×2): 0.4 mg via ORAL

## 2018-08-06 MED ORDER — DIPHENHYDRAMINE HCL 50 MG/ML IJ SOLN
50 mg | Freq: Once | INTRAVENOUS | 0 refills | Status: CP
Start: 2018-08-06 — End: ?
  Administered 2018-08-06: 18:00:00 50 mg via INTRAVENOUS

## 2018-08-06 MED ORDER — EPINEPHRINE HCL (PF) 1 MG/ML (1 ML) IJ SOLN
.3-.5 mg | INTRAMUSCULAR | 0 refills | Status: DC | PRN
Start: 2018-08-06 — End: 2018-08-07

## 2018-08-06 MED ORDER — DIPHENHYDRAMINE HCL 25 MG PO CAP
25 mg | Freq: Once | ORAL | 0 refills | Status: CP
Start: 2018-08-06 — End: ?
  Administered 2018-08-06: 18:00:00 25 mg via ORAL

## 2018-08-06 MED ORDER — MORPHINE 15 MG PO TAB
15-30 mg | ORAL | 0 refills | Status: DC | PRN
Start: 2018-08-06 — End: 2018-08-07
  Administered 2018-08-06: 15 mg via ORAL
  Administered 2018-08-06: 17:00:00 30 mg via ORAL
  Administered 2018-08-06 – 2018-08-07 (×2): 15 mg via ORAL

## 2018-08-06 MED ORDER — HYDROXYZINE HCL 25 MG PO TAB
25 mg | Freq: Three times a day (TID) | ORAL | 0 refills | Status: DC | PRN
Start: 2018-08-06 — End: 2018-08-07

## 2018-08-06 MED ORDER — SODIUM CHLORIDE 0.9 % IV SOLP
500 mL | INTRAVENOUS | 0 refills | Status: CP
Start: 2018-08-06 — End: ?
  Administered 2018-08-06: 22:00:00 500 mL via INTRAVENOUS

## 2018-08-06 MED ORDER — IRON SUCROSE 300 MG IRON/15 ML IV SOLN
300 mg | Freq: Once | INTRAVENOUS | 0 refills | Status: CP
Start: 2018-08-06 — End: ?
  Administered 2018-08-06 (×2): 300 mg via INTRAVENOUS

## 2018-08-06 MED ORDER — DIPHENHYDRAMINE HCL 50 MG/ML IJ SOLN
25 mg | INTRAVENOUS | 0 refills | Status: DC | PRN
Start: 2018-08-06 — End: 2018-08-07

## 2018-08-06 MED ORDER — FERROUS SULFATE 325 MG (65 MG IRON) PO TAB
325 mg | Freq: Three times a day (TID) | ORAL | 0 refills | Status: DC
Start: 2018-08-06 — End: 2018-08-06
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
## 2018-08-06 NOTE — Progress Notes
Notified [facility] of pulse 142. Order received for normal saline bolus.
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
## 2018-08-06 NOTE — Case Management (ED)
Case Management Progress Note    NAME:Paul [name]                          MRN: [id number]              DOB:[birth year]          AGE: 33 y.o.  ADMISSION DATE: 08/04/2018             DAYS ADMITTED: LOS: 2 days      Todays Date: 08/06/2018    Plan  DC pending medical stability (monitoring hgb, AWAS, ortho [place] on [place]), work with therapy (rec inpatient setting), pain control    Interventions  ? Support     - Clinical resources updated to include education on injuries, prevention and self care    ? Info or Referral      ? Discharge Planning    DC inpatient setting- rehab consult placed  SW following    ? Medication Needs    has insurance coverage for DC meds         ? Financial      ? Legal      ? Other        Disposition  ? Expected Discharge Date    Expected Discharge Date: 08/12/18  ? Transportation   Does the patient need discharge transport arranged?: Yes  Does the patient use Medicaid Transportation?: (He has not used Medicaid transportation but is open to it.)  ? Next Level of Care (Acute Psych discharges only)      ? Discharge Disposition                                          Durable Medical Equipment      No service has been selected for the patient.      [place] Destination      No service has been selected for the patient.      [place]      No service has been selected for the patient.      [facility]      No service has been selected for the patient.        Pincus Sanes, RN, BSN, [place]  Trauma Case Manager  Phone: [phone]  Pager: [phone]  Fax: [phone]

## 2018-08-06 NOTE — Progress Notes
Patient's foley catheter removed, tolerated well. Patient accepted education on catheter removal protocol, due to void by 1015.
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
## 2018-08-06 NOTE — Progress Notes
Pt c/o 9/10 pain being unrelieved with current pain regimen. Trauma paged and notified. awaiting response

## 2018-08-06 NOTE — Progress Notes
OCCUPATIONAL THERAPY  NOTE     Name: Paul [name]        MRN: [id number]          DOB: [birth year]          Age: 33 y.o.  Admission Date: 08/04/2018             LOS: 2 days    Patient was unavailable for occupational therapy. Per RN, patient had just returned to bed and was fatigued. RN also reported patient was about to receive IV Benadryl as well as iron, therefore patient would likely be too fatigued for meaningful therapy session. RN asked OT to hold at this time. Occupational therapy will continue to follow and provide intervention as indicated.    Therapist: Rachel Bo, OTR/L [id number]  Date: 08/06/2018
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
## 2018-08-06 NOTE — Progress Notes
Patient unable to void after foley catheter removed. Highest bladder scan reading 423ml. [place] notified and orders received for tamsulosin and to perform straight cath.

## 2018-08-07 ENCOUNTER — Encounter: Admit: 2018-08-04 | Discharge: 2018-08-04 | Attending: Trauma Surgery | Admitting: Trauma Surgery

## 2018-08-07 ENCOUNTER — Encounter: Admit: 2018-08-07 | Discharge: 2018-08-07 | Attending: Trauma Surgery | Admitting: Trauma Surgery

## 2018-08-07 ENCOUNTER — Encounter: Admit: 2018-08-05 | Discharge: 2018-08-05 | Attending: Trauma Surgery | Admitting: Trauma Surgery

## 2018-08-07 DIAGNOSIS — G479 Sleep disorder, unspecified: Secondary | ICD-10-CM

## 2018-08-07 DIAGNOSIS — G8918 Other acute postprocedural pain: Secondary | ICD-10-CM

## 2018-08-07 DIAGNOSIS — S72112A Displaced fracture of greater trochanter of left femur, initial encounter for closed fracture: Secondary | ICD-10-CM

## 2018-08-07 DIAGNOSIS — D62 Acute posthemorrhagic anemia: Secondary | ICD-10-CM

## 2018-08-07 DIAGNOSIS — S72302A Unspecified fracture of shaft of left femur, initial encounter for closed fracture: Secondary | ICD-10-CM

## 2018-08-07 DIAGNOSIS — Z7409 Other reduced mobility: Secondary | ICD-10-CM

## 2018-08-07 DIAGNOSIS — F419 Anxiety disorder, unspecified: Secondary | ICD-10-CM

## 2018-08-07 DIAGNOSIS — F845 Asperger's syndrome: Secondary | ICD-10-CM

## 2018-08-07 DIAGNOSIS — F17213 Nicotine dependence, cigarettes, with withdrawal: Secondary | ICD-10-CM

## 2018-08-07 DIAGNOSIS — F1010 Alcohol abuse, uncomplicated: Secondary | ICD-10-CM

## 2018-08-07 DIAGNOSIS — S72112D Displaced fracture of greater trochanter of left femur, subsequent encounter for closed fracture with routine healing: Principal | ICD-10-CM

## 2018-08-07 DIAGNOSIS — N179 Acute kidney failure, unspecified: Secondary | ICD-10-CM

## 2018-08-07 DIAGNOSIS — S72409A Unspecified fracture of lower end of unspecified femur, initial encounter for closed fracture: Secondary | ICD-10-CM

## 2018-08-07 DIAGNOSIS — Z1159 Encounter for screening for other viral diseases: Secondary | ICD-10-CM

## 2018-08-07 DIAGNOSIS — T1490XA Injury, unspecified, initial encounter: Secondary | ICD-10-CM

## 2018-08-07 DIAGNOSIS — G8911 Acute pain due to trauma: Secondary | ICD-10-CM

## 2018-08-07 DIAGNOSIS — S72002A Fracture of unspecified part of neck of left femur, initial encounter for closed fracture: Secondary | ICD-10-CM

## 2018-08-07 DIAGNOSIS — Z8781 Personal history of (healed) traumatic fracture: Secondary | ICD-10-CM

## 2018-08-07 LAB — PHOSPHORUS: Lab: 2.5 mg/dL — ABNORMAL LOW (ref 2.0–4.5)

## 2018-08-07 LAB — MAGNESIUM: Lab: 1.8 mg/dL — ABNORMAL LOW (ref 1.6–2.6)

## 2018-08-07 LAB — CBC
Lab: 10 10*3/uL (ref 4.5–11.0)
Lab: 13 % (ref >60)
Lab: 2.2 M/UL — ABNORMAL LOW (ref 4.4–5.5)
Lab: 20 % — ABNORMAL LOW (ref 40–50)
Lab: 31 pg — ABNORMAL HIGH (ref 26–34)
Lab: 34 g/dL — ABNORMAL LOW (ref 32.0–36.0)

## 2018-08-07 LAB — BASIC METABOLIC PANEL
Lab: 133 MMOL/L — ABNORMAL LOW (ref 137–147)
Lab: 3.8 MMOL/L — ABNORMAL LOW (ref 3.5–5.1)

## 2018-08-07 MED ORDER — ASCORBIC ACID (VITAMIN C) 500 MG PO TAB
500 mg | Freq: Every day | ORAL | 0 refills | Status: CN
Start: 2018-08-07 — End: ?

## 2018-08-07 MED ORDER — ENOXAPARIN 30 MG/0.3 ML SC SYRG
30 mg | Freq: Two times a day (BID) | SUBCUTANEOUS | 0 refills | 7.00000 days | Status: DC
Start: 2018-08-07 — End: 2018-08-12

## 2018-08-07 MED ORDER — SENNOSIDES-DOCUSATE SODIUM 8.6-50 MG PO TAB
2 | Freq: Two times a day (BID) | ORAL | 0 refills | Status: DC
Start: 2018-08-07 — End: 2018-08-07
  Administered 2018-08-07: 14:00:00 2 via ORAL

## 2018-08-07 MED ORDER — POTASSIUM PHOSPHATE, MONOBASIC 500 MG PO TBSO
2 | Freq: Once | ORAL | 0 refills | Status: CP
Start: 2018-08-07 — End: ?
  Administered 2018-08-07: 16:00:00 2 via ORAL

## 2018-08-07 MED ORDER — ALUMINUM-MAGNESIUM HYDROXIDE 200-200 MG/5 ML PO SUSP
30 mL | ORAL | 0 refills | Status: DC | PRN
Start: 2018-08-07 — End: 2018-08-13
  Administered 2018-08-08 – 2018-08-12 (×3): 30 mL via ORAL

## 2018-08-07 MED ORDER — BISACODYL 10 MG RE SUPP
10 mg | Freq: Every day | RECTAL | 0 refills | 4.00000 days | Status: DC
Start: 2018-08-07 — End: 2018-08-12

## 2018-08-07 MED ORDER — CYCLOBENZAPRINE 10 MG PO TAB
10 mg | Freq: Three times a day (TID) | ORAL | 0 refills | Status: DC
Start: 2018-08-07 — End: 2018-08-13
  Administered 2018-08-08 – 2018-08-13 (×18): 10 mg via ORAL

## 2018-08-07 MED ORDER — THIAMINE MONONITRATE (VIT B1) 100 MG PO TAB
100 mg | Freq: Every day | ORAL | 0 refills | 10.00000 days | Status: DC
Start: 2018-08-07 — End: 2018-08-12

## 2018-08-07 MED ORDER — ACETAMINOPHEN 325 MG PO TAB
650 mg | ORAL | 0 refills | Status: DC | PRN
Start: 2018-08-07 — End: 2018-08-07

## 2018-08-07 MED ORDER — MAGNESIUM SULFATE IN D5W 1 GRAM/100 ML IV PGBK
1 g | INTRAVENOUS | 0 refills | Status: CP
Start: 2018-08-07 — End: ?
  Administered 2018-08-07 (×2): 1 g via INTRAVENOUS

## 2018-08-07 MED ORDER — GABAPENTIN 300 MG PO CAP
600 mg | Freq: Three times a day (TID) | ORAL | 0 refills | Status: CP
Start: 2018-08-07 — End: ?
  Administered 2018-08-09 – 2018-08-11 (×6): 600 mg via ORAL

## 2018-08-07 MED ORDER — TAMSULOSIN 0.4 MG PO CAP
0.4 mg | Freq: Every day | ORAL | 0 refills | Status: DC
Start: 2018-08-07 — End: 2018-08-13
  Administered 2018-08-08 – 2018-08-13 (×6): 0.4 mg via ORAL

## 2018-08-07 MED ORDER — BISACODYL 10 MG RE SUPP
10 mg | Freq: Every day | RECTAL | 0 refills | Status: DC
Start: 2018-08-07 — End: 2018-08-07
  Administered 2018-08-07: 15:00:00 10 mg via RECTAL

## 2018-08-07 MED ORDER — POLYETHYLENE GLYCOL 3350 17 GRAM PO PWPK
17 g | Freq: Every day | ORAL | 0 refills | 22.00000 days | Status: DC
Start: 2018-08-07 — End: 2018-08-12

## 2018-08-07 MED ORDER — GABAPENTIN 300 MG PO CAP
900 mg | ORAL_CAPSULE | Freq: Three times a day (TID) | ORAL | 0 refills | 30.00000 days | Status: DC
Start: 2018-08-07 — End: 2018-08-12

## 2018-08-07 MED ORDER — CALCIUM CARBONATE 200 MG CALCIUM (500 MG) PO CHEW
500-1000 mg | ORAL | 0 refills | 90.00000 days | Status: DC | PRN
Start: 2018-08-07 — End: 2018-08-12

## 2018-08-07 MED ORDER — MORPHINE 15 MG PO TAB
30 mg | ORAL | 0 refills | Status: DC | PRN
Start: 2018-08-07 — End: 2018-08-12
  Administered 2018-08-08 – 2018-08-12 (×26): 30 mg via ORAL

## 2018-08-07 MED ORDER — MELATONIN 5 MG PO TAB
10 mg | Freq: Every evening | ORAL | 0 refills | Status: DC
Start: 2018-08-07 — End: 2018-08-13
  Administered 2018-08-08 – 2018-08-13 (×6): 10 mg via ORAL

## 2018-08-07 MED ORDER — ACETAMINOPHEN 500 MG PO TAB
1000 mg | ORAL | 0 refills | 8.00000 days | Status: DC
Start: 2018-08-07 — End: 2018-08-12

## 2018-08-07 MED ORDER — SENNOSIDES 8.6 MG PO TAB
2 | Freq: Every evening | ORAL | 0 refills | Status: DC
Start: 2018-08-07 — End: 2018-08-13
  Administered 2018-08-08 – 2018-08-13 (×4): 2 via ORAL

## 2018-08-07 MED ORDER — MELATONIN 5 MG PO TAB
10 mg | Freq: Every evening | ORAL | 0 refills | 28.00000 days | Status: DC
Start: 2018-08-07 — End: 2018-08-12

## 2018-08-07 MED ORDER — DOCUSATE SODIUM 100 MG PO CAP
100 mg | Freq: Two times a day (BID) | ORAL | 0 refills | Status: DC
Start: 2018-08-07 — End: 2018-08-13
  Administered 2018-08-08 – 2018-08-13 (×9): 100 mg via ORAL

## 2018-08-07 MED ORDER — NICOTINE 14 MG/24 HR TD PT24
1 | Freq: Every day | TRANSDERMAL | 0 refills | Status: DC
Start: 2018-08-07 — End: 2018-08-13
  Administered 2018-08-08 – 2018-08-13 (×6): 1 via TRANSDERMAL

## 2018-08-07 MED ORDER — ASCORBIC ACID (VITAMIN C) 500 MG PO TAB
500 mg | Freq: Every day | ORAL | 0 refills | Status: DC
Start: 2018-08-07 — End: 2018-08-13
  Administered 2018-08-08 – 2018-08-13 (×6): 500 mg via ORAL

## 2018-08-07 MED ORDER — CYCLOBENZAPRINE 10 MG PO TAB
10 mg | Freq: Three times a day (TID) | ORAL | 0 refills | 30.00000 days | Status: DC
Start: 2018-08-07 — End: 2018-08-12

## 2018-08-07 MED ORDER — THIAMINE MONONITRATE (VIT B1) 100 MG PO TAB
100 mg | Freq: Every day | ORAL | 0 refills | Status: CN
Start: 2018-08-07 — End: ?

## 2018-08-07 MED ORDER — FERROUS GLUCONATE 324 MG (37.5 MG IRON) PO TAB
324 mg | Freq: Every day | ORAL | 0 refills | Status: DC
Start: 2018-08-07 — End: 2018-08-12

## 2018-08-07 MED ORDER — MORPHINE 15 MG PO TAB
30 mg | ORAL | 0 refills | Status: CN | PRN
Start: 2018-08-07 — End: ?

## 2018-08-07 MED ORDER — ONDANSETRON HCL 4 MG PO TAB
4 mg | ORAL | 0 refills | Status: DC | PRN
Start: 2018-08-07 — End: 2018-08-13

## 2018-08-07 MED ORDER — FOLIC ACID 1 MG PO TAB
1 mg | Freq: Every day | ORAL | 0 refills | Status: DC
Start: 2018-08-07 — End: 2018-08-13
  Administered 2018-08-08 – 2018-08-13 (×6): 1 mg via ORAL

## 2018-08-07 MED ORDER — GABAPENTIN 300 MG PO CAP
300 mg | Freq: Three times a day (TID) | ORAL | 0 refills | Status: CP
Start: 2018-08-07 — End: ?
  Administered 2018-08-11 – 2018-08-13 (×6): 300 mg via ORAL

## 2018-08-07 MED ORDER — ACETAMINOPHEN 500 MG PO TAB
1000 mg | ORAL | 0 refills | Status: DC
Start: 2018-08-07 — End: 2018-08-13
  Administered 2018-08-08 – 2018-08-13 (×22): 1000 mg via ORAL

## 2018-08-07 MED ORDER — GABAPENTIN 300 MG PO CAP
600 mg | ORAL_CAPSULE | Freq: Three times a day (TID) | ORAL | 0 refills | 30.00000 days | Status: DC
Start: 2018-08-07 — End: 2018-08-12

## 2018-08-07 MED ORDER — NICOTINE 14 MG/24 HR TD PT24
1 | Freq: Every day | TRANSDERMAL | 0 refills | Status: CN
Start: 2018-08-07 — End: ?

## 2018-08-07 MED ORDER — GABAPENTIN 300 MG PO CAP
300 mg | Freq: Three times a day (TID) | ORAL | 0 refills | Status: CN
Start: 2018-08-07 — End: ?

## 2018-08-07 MED ORDER — MAGNESIUM HYDROXIDE 2,400 MG/10 ML PO SUSP
10 mL | ORAL | 0 refills | Status: DC | PRN
Start: 2018-08-07 — End: 2018-08-13
  Administered 2018-08-08 – 2018-08-09 (×6): 10 mL via ORAL

## 2018-08-07 MED ORDER — TAMSULOSIN 0.4 MG PO CAP
0.4 mg | Freq: Every day | ORAL | 0 refills | Status: CN
Start: 2018-08-07 — End: ?

## 2018-08-07 MED ORDER — MELATONIN 5 MG PO TAB
10 mg | Freq: Every evening | ORAL | 0 refills | Status: CN
Start: 2018-08-07 — End: ?

## 2018-08-07 MED ORDER — ENOXAPARIN 30 MG/0.3 ML SC SYRG
30 mg | Freq: Two times a day (BID) | SUBCUTANEOUS | 0 refills | Status: CN
Start: 2018-08-07 — End: ?

## 2018-08-07 MED ORDER — BISACODYL 10 MG RE SUPP
10 mg | Freq: Every day | RECTAL | 0 refills | Status: DC | PRN
Start: 2018-08-07 — End: 2018-08-13

## 2018-08-07 MED ORDER — GABAPENTIN 300 MG PO CAP
300 mg | Freq: Three times a day (TID) | ORAL | 0 refills | 30.00000 days | Status: DC
Start: 2018-08-07 — End: 2018-08-12

## 2018-08-07 MED ORDER — MORPHINE 30 MG PO TAB
30 mg | ORAL | 0 refills | 7.00000 days | Status: DC | PRN
Start: 2018-08-07 — End: 2018-08-12

## 2018-08-07 MED ORDER — FOLIC ACID 1 MG PO TAB
1 mg | ORAL_TABLET | Freq: Every day | ORAL | 0 refills | 30.00000 days | Status: DC
Start: 2018-08-07 — End: 2018-08-12

## 2018-08-07 MED ORDER — ACETAMINOPHEN 500 MG PO TAB
1000 mg | ORAL | 0 refills | Status: CN
Start: 2018-08-07 — End: ?

## 2018-08-07 MED ORDER — ENOXAPARIN 30 MG/0.3 ML SC SYRG
30 mg | Freq: Two times a day (BID) | SUBCUTANEOUS | 0 refills | Status: DC
Start: 2018-08-07 — End: 2018-08-08
  Administered 2018-08-08 (×2): 30 mg via SUBCUTANEOUS

## 2018-08-07 MED ORDER — GABAPENTIN 300 MG PO CAP
900 mg | Freq: Three times a day (TID) | ORAL | 0 refills | Status: CP
Start: 2018-08-07 — End: ?
  Administered 2018-08-08 – 2018-08-09 (×4): 900 mg via ORAL

## 2018-08-07 MED ORDER — HYDROXYZINE HCL 25 MG PO TAB
25 mg | ORAL_TABLET | Freq: Three times a day (TID) | ORAL | 0 refills | 30.00000 days | Status: DC | PRN
Start: 2018-08-07 — End: 2018-08-12

## 2018-08-07 MED ORDER — FERROUS GLUCONATE 324 MG (37.5 MG IRON) PO TAB
324 mg | Freq: Every day | ORAL | 0 refills | Status: DC
Start: 2018-08-07 — End: 2018-08-13
  Administered 2018-08-08 – 2018-08-13 (×6): 324 mg via ORAL

## 2018-08-07 MED ORDER — CYCLOBENZAPRINE 10 MG PO TAB
10 mg | Freq: Three times a day (TID) | ORAL | 0 refills | Status: CN
Start: 2018-08-07 — End: ?

## 2018-08-07 MED ORDER — ASCORBIC ACID (VITAMIN C) 500 MG PO TAB
500 mg | ORAL_TABLET | Freq: Every day | ORAL | 0 refills | 50.00000 days | Status: DC
Start: 2018-08-07 — End: 2018-08-12

## 2018-08-07 MED ORDER — THIAMINE MONONITRATE (VIT B1) 100 MG PO TAB
100 mg | Freq: Every day | ORAL | 0 refills | Status: DC
Start: 2018-08-07 — End: 2018-08-13
  Administered 2018-08-08 – 2018-08-13 (×6): 100 mg via ORAL

## 2018-08-07 MED ORDER — SENNOSIDES-DOCUSATE SODIUM 8.6-50 MG PO TAB
2 | Freq: Two times a day (BID) | ORAL | 0 refills | 30.00000 days | Status: DC
Start: 2018-08-07 — End: 2018-08-12

## 2018-08-07 MED ORDER — PATCH DOCUMENTATION - NICOTINE 14 MG/24HR
Freq: Two times a day (BID) | TRANSDERMAL | 0 refills | Status: CN
Start: 2018-08-07 — End: ?

## 2018-08-07 MED ORDER — PATCH DOCUMENTATION - NICOTINE 14 MG/24HR
Freq: Two times a day (BID) | TRANSDERMAL | 0 refills | Status: DC
Start: 2018-08-07 — End: 2018-08-13

## 2018-08-07 MED ORDER — FERROUS GLUCONATE 324 MG (37.5 MG IRON) PO TAB
324 mg | Freq: Every day | ORAL | 0 refills | Status: CN
Start: 2018-08-07 — End: ?

## 2018-08-07 MED ORDER — GABAPENTIN 300 MG PO CAP
900 mg | Freq: Three times a day (TID) | ORAL | 0 refills | Status: CN
Start: 2018-08-07 — End: ?

## 2018-08-07 MED ORDER — GABAPENTIN 300 MG PO CAP
600 mg | Freq: Three times a day (TID) | ORAL | 0 refills | Status: CN
Start: 2018-08-07 — End: ?

## 2018-08-07 MED ORDER — NICOTINE 14 MG/24 HR TD PT24
1 | MEDICATED_PATCH | Freq: Every day | TRANSDERMAL | 0 refills | 28.00000 days | Status: DC
Start: 2018-08-07 — End: 2018-08-12

## 2018-08-07 MED ORDER — TAMSULOSIN 0.4 MG PO CAP
0.4 mg | ORAL_CAPSULE | Freq: Every day | ORAL | 0 refills | 90.00000 days | Status: DC
Start: 2018-08-07 — End: 2018-08-12

## 2018-08-07 MED ORDER — FOLIC ACID 1 MG PO TAB
1 mg | Freq: Every day | ORAL | 0 refills | Status: CN
Start: 2018-08-07 — End: ?

## 2018-08-07 NOTE — Progress Notes
Paul [name] discharged on 08/07/2018.  Discharge instructions reviewed with patient.  Valuables returned:   Personal Items / Valuables: Clothing(socks in bag in pacu)  Where Are Valuables Stored?: patient has clothes on but are cut up, and agrees for them to be thrown away when patient goes into surgery in the OR .  Home medications:   Functional assessment at discharge complete: Yes      Report given to [place].

## 2018-08-07 NOTE — Care Plan
Problem: Infection, Risk of  Goal: Absence of infection  Outcome: Goal Achieved  Goal: Knowledge of Infection Control Procedures  Outcome: Goal Achieved     Problem: Falls, High Risk of  Goal: Absence of falls-Adult Patient  Outcome: Goal Achieved  Goal: Absence of Falls-Pediatric patient  Outcome: Goal Achieved     Problem: Infection, Risk of, Urinary Catheter-Associated Urinary Tract Infection  Goal: Absence of urinary catheter-associated infection  Outcome: Goal Achieved     Problem: Discharge Planning  Goal: Participation in plan of care  Outcome: Goal Achieved  Goal: Knowledge regarding plan of care  Outcome: Goal Achieved  Goal: Prepared for discharge  Outcome: Goal Achieved     Problem: Mobility/Activity Intolerance  Goal: Maximize functional ADL's and mobility outcomes  Outcome: Goal Achieved     Problem: Self-Care Deficit  Goal: Maximize ADL functioning  Outcome: Goal Achieved     Problem: Pain  Goal: Management of pain  Outcome: Goal Achieved  Goal: Knowledge of pain management  Outcome: Goal Achieved  Goal: Progress Toward Pain Management Goals  Outcome: Goal Achieved     Problem: Anxiety  Goal: Alleviation of anxiety  Outcome: Goal Achieved     Problem: Mood - Altered  Goal: Stabilize mood  Outcome: Goal Achieved  Goal: Knowledge of Altered Mood  Outcome: Goal Achieved
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
## 2018-08-07 NOTE — Case Management (ED)
Case Management Progress Note    NAME:Paul [name]                          MRN: [id number]              DOB:1985-12-30          AGE: 33 y.o.  ADMISSION DATE: 08/04/2018             DAYS ADMITTED: LOS: 3 days      Todays Date: 08/07/2018    Plan  Dc to [facility] at 5:15pm via [facility] transport.     Interventions  ? Support      ? Info or Referral      ? Discharge Planning    SW updated by medical team, pt is stable for dc.   SW reached out to [facility] admissions, they would like for pt to work with OT.     Pt worked with OT and [facility] admissions stated they are able to transfer pt at 5:15pm tonight,  SW notified medical team, bedside RN and pt (via bedside phone. - pt stated he will call family to notify).     ? Medication Needs                                                 ? Financial      ? Legal      ? Other        Disposition  ? Expected Discharge Date    Expected Discharge Date: 08/07/18  Expected Discharge Time: 9323  ? Transportation   Does the patient need discharge transport arranged?: Yes  Does the patient use Medicaid Transportation?: (He has not used Medicaid transportation but is open to it.)  ? Next Level of Care (Acute Psych discharges only)      ? Discharge Disposition                                          Durable Medical Equipment      No service has been selected for the patient.      [facility] - Selection Complete      Service Provider Request Status Selected Services Address Phone Number Fax Number    [facility] [address] [place] [facility] [place] [address] [phone] [facility]      No service has been selected for the patient.      [facility]      No service has been selected for the patient.
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
## 2018-08-07 NOTE — Progress Notes
Patient arrived to room # 2227 via wheelchair accompanied by transport. Patient transferred to the bed with assistance. Bedside safety checks completed. Initial patient assessment completed. Refer to flowsheet for details.    Admission skin assessment completed with: Amado Coe, RN    Pressure injury present on arrival?: No    1. Head/Face/Neck: No  2. Trunk/Back: No  3. Upper Extremities: No  4. Lower Extremities: No  5. Pelvic/Coccyx: No  6. Assessed for device associated injury? Yes  7. Malnutrition Screening Tool (Nursing Nutrition Assessment) Completed? Yes    See Doc Flowsheet for additional wound details.     INTERVENTIONS:
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
## 2018-08-08 ENCOUNTER — Encounter: Admit: 2018-08-08 | Discharge: 2018-08-08

## 2018-08-08 LAB — BASIC METABOLIC PANEL CELLULAR THERAPEUTICS
Lab: 138 MMOL/L — ABNORMAL LOW (ref >60)
Lab: 30 MMOL/L — ABNORMAL HIGH (ref 21–30)

## 2018-08-08 LAB — CBC CELLULAR THERAPEUTICS
Lab: 2.2 M/UL — ABNORMAL LOW (ref >60)
Lab: 8.3 K/UL — ABNORMAL LOW (ref >60)

## 2018-08-08 MED ORDER — ENOXAPARIN 40 MG/0.4 ML SC SYRG
40 mg | Freq: Every day | SUBCUTANEOUS | 0 refills | Status: DC
Start: 2018-08-08 — End: 2018-08-13
  Administered 2018-08-09 – 2018-08-13 (×5): 40 mg via SUBCUTANEOUS

## 2018-08-08 NOTE — Progress Notes
Admit Note:  Admission profile attempted x 1  Patient off unit.  Will follow up

## 2018-08-08 NOTE — Consults
ACTIVITY THERAPY   Initial Assessment of Interests    Name: Paul [name] ;   DOB: [birth year]; 33 y.o.; male   Problems List: Present on Admission:   History of femur fracture      COVID-19 has shifted activity therapy programming. Prior to meeting with this patient their H&P and other progress notes were reviewed for general understanding of their disposition.       Subjective     Pt was agreeable to participating in the assessment. Pt presented with euthymic affect.                          Objective   Pt received education on the role of activity therapy, to encourage enragement in social and leisure interests. They also answered questions regarding their interests.    Assessment    Leisure  Patient reports enjoying listening to music (variety) and going on walks. He does not currently have a smartphone, but his mom is getting one.    Environmental/  In-room resources  Patient received a radio for his room.    Plan  Therapist to provide 1-2 AT sessions each week of pt's stay to support assessed interests.        Pauline Aus, Activity Therapist, MA MT-BC  [employer] @ Pauline Aus

## 2018-08-08 NOTE — Progress Notes
Foley catheter discontinued. Pt. tolerated the procedure well.

## 2018-08-08 NOTE — Progress Notes
RT Adult Assessment Note    NAME:Paul [name]             MRN: [id number]             DOB:[birth year]          AGE: 33 y.o.  ADMISSION DATE: 08/07/2018             DAYS ADMITTED: LOS: 0 days    RT Treatment Plan:  Protocol Plan: Medications  Albuterol: MDI PRN    Protocol Plan: Procedures  PAP: Place a nursing order for "IS Q1h While Awake" for any of Lung Expansion indicators  Oxygen/Humidity: O2 to keep SpO2 > 92%, if not on any RT modality, D/C protocol if greater than 24 hours on room air  SpO2: BID & PRN    Additional Comments:  Impressions of the patient: nonlabored, cooperative  Intervention(s)/outcome(s): oxygen and monitoring, incentive spirometry, alb PRN  Patient education that was completed:   Recommendations to the care team:     Vital Signs:  Pulse: (!) 123  RR: 20 PER MINUTE  SpO2: 100 %  O2 Device: Cannula  Liter Flow: 1 Lpm  O2%:    Breath Sounds: Clear (Implies normal);Decreased  Respiratory Effort:

## 2018-08-08 NOTE — Progress Notes
ORTHOTICS/PROSTHETICS  Consult Note:      NAME: Paul [name]  ADMISSION DATE: admitted to hospital  DOB: 21-Mar-1985  AGE: 33 y.o.  ROOM: R2227/01  DOCTOR:     Date of Order: 08-07-18  Date of Service: 08-08-18    Services referred for: Orthotic Eval and Treat: Ankle night splint    Description of condition/injury, including services:Trauma    Size: medium Side left      Measurements: Area/circumference/Diameter/Length na    Functional Goals discussed for device use:  Joint stabilization (support and alignment)  Increase or decrease range of motion  Reduce edema  Facilitate healing of injury  Decrease pain  Prevent or correct deformity  Post surgical support/correction  Increase ADLs or IADLs    Device is to be ordered na    Estimated date of delivery now    Functional goals met: Yes    The patient states satisfaction with the fit/function of device: Yes    Additional supplies provided to the patient: na    Patient Education:  Written and/or verbal instruction/information provided to Patient  Information provided: device function, usage/break-in period, safety issues, donning/doffing of device, how/whom to report problems related to device/change in physical condition, care and cleaning, fitting issues, benefits and precautions and skin inspection      Patient did tolerate the procedure without incident/problem.    Follow-up scheduled: prn    PLAN:  Order completed    Juliette Alcide  08/08/2018

## 2018-08-08 NOTE — Progress Notes
Maalox 30 cc given po for reflux.
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
## 2018-08-08 NOTE — Progress Notes
PHYSICAL THERAPY       08/08/18 1100   Precautions   L LE Precautions LLE Toe Touch Weight Bearing   Comments No active LLE abdcution   Cognitive   Orientation Oriented x4   Patient Behavior Calm;Cooperative   Cognition Follows Commands   Subjective   Subjective Patient in bed but ready for therapy.   Bed Mobility   Bed Mobility: Supine to sit assist level Stand by assistance   Bed Mobility: Supine to sit type of assist   (leg lifter and cues for use)   Bed Mobility: Sit to Supine Assist Level Moderate assistance   Bed Mobility: Sit to supine type of assist Assist with left lower extremity   Bed Mobility Comments Patient given leg lifter to assist bed mobility   Transfers   Transfer: Assistive Device Roller Walker   Transfer: Sit to stand assist level Contact guard assistance   Transfer: Sit to stand type of assist For safety;Follows precautions   Transfer: Stand to sit assist level Contact guard assistance   Transfer: Stand to sit type of assist For safety   Transfer: Stand pivot assist level Contact guard assistance   Transfer: Stand pivot type of assist For safety;Follows precautions   Transfer Comment Trialled sit to stand with axillary crutches with minimal assist   Gait   Gait Distance 20 feet  (x3, 10)   GAIT: Assist Level Contact guard assistance   GAIT: Type of Assist For safety;Follows precautions  (cues to slow pace)   Gait: Hospital doctor   Activity Limited By Complaint of pain   Gait Comment Trialled use of axillary crutches with minimal assist for balance x 8 feet   Stairs   Stairs Comment Discussed steps at home and need for trial with crutches or ramp.  Patient reports he will enter home with assist of friends and putting arm around their shoulder.   Wheelchair   Wheelchair: Psychiatric nurse: Assistance Level Modified independent (device)   Wheelchair: Propulsion Method Bilateral upper extremity;Manual wheelchair   Wheelchair: Ramp Management Modified independent Wheelchair Comments Reviewed releases for leg rests, use of leg lifter for LLE to remove and place LE on leg rests   Assessment   Assessment Patient uncomfortable with trial of axillary crutches.  He expressed wanting to continue with use of roller walker.  Patient with plans to enter steps with assist of friends.  Will trial in PT to ensure he can maintain weight bearing precautions and assess level of pain with activity.    Plan   Comments bed mobility with use of leg lifter, gait with roller walker, transfers with roller walker, stairs with UE support around PT shoulders, wheelchair propulsion and endurance, LLE active assisted ROM except abduction    Recommendations   PT Discharge Recommendations Home;Home Health Setting   Equipment Recommendations Wheelchair - Manual;Wheelchair cushion;Walker - Roller;Commode   Expected Discharge Date 08/13/18     Raynelle Highland, DPT, NCS  Doctor of Physical Therapy, Board Certified Specialist in Neurologic Physical Therapy
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
## 2018-08-09 LAB — BASIC METABOLIC PANEL CELLULAR THERAPEUTICS: Lab: 136 MMOL/L — ABNORMAL LOW (ref >60)

## 2018-08-09 LAB — CBC CELLULAR THERAPEUTICS: Lab: 10 10*3/uL — ABNORMAL LOW (ref 4.5–11.0)

## 2018-08-09 MED ORDER — SODIUM CHLORIDE 0.9 % FLUSH
3-5 mL | Freq: Three times a day (TID) | 0 refills | Status: DC
Start: 2018-08-09 — End: 2018-08-13

## 2018-08-09 NOTE — Progress Notes
OCCUPATIONAL THERAPY     08/09/18 1200   Precautions   L LE Precautions LLE Toe Touch Weight Bearing   Comments No active LLE abdcution   Subjective   Subjective I'm having a hard time with getting out of bed because of my L leg.   Grooming   Grooming - Brush Teeth Assist No   Grooming - Wash Both Hands Assist No   Grooming Assist Stand By Assist   Grooming Position Standing for ___ % of task   Grooming Comments pt stands at sink with counter for support x 2 min, close SBA. Requires seated rest break to follow.   Bed Mobility   Bed Mobility: Supine to sit assist level Minimum assistance   Bed Mobility: Supine to sit type of assist Assist with Left lower extremity   Bed Mobility Comments Despite use of leg lifter, pt requires assist for mobilization of LLE with bed mobility. Pt/family instruction re: transferring toward R side of bed to avoid L hip abduction. Pt remains seated in w/c for lunch at end of session.   Transfers   Transfer: Product/process development scientist: Sit to stand assist level Contact guard assistance   Transfer: Sit to stand type of assist For safety   Transfer: Stand to sit assist level Contact guard assistance   Transfer: Stand to sit type of assist For safety   Transfer: Stand pivot assist level Contact guard assistance   Transfer: Stand pivot type of assist For safety   Transfer Comment Pt ambulates from EOB>bathroom with RW support and good adherence to LLE TTWB restrictions. Pt propels w/c from room <>therapy gym, seated rest break to follow due to fatigue.   Instrumental Acitivities of Daily Living (IADL)   IADL Light meal preparation;Home management   IADL Assist Level Stand by assist   IADL Cues Pt stands at kitchen counter, counter for support, to prepare sandwich with setup assist only, no LOB, x 6 min.   Activity   Therapeutic Activities ADLs/mobility as described above.  Simple meal prep as described above. Pt education re: safety with item transport/retrieval in the home with use of RW, reacher and walker basket. Pt education re: kitchen management/item retrieval at varied levels, with pt return demo understanding, wc level.    Assessment   Assessment Pt highly frustrated at times throughout session, perseverating on desire to go home and lack of ability to receive needed equipment due to  holiday weekend. Pt progressing with functional transfers/functional standing tolerance with support of AE/AD. Pt continues to fatigue following activity.   Plan   Plan Comments LE dressing; toileting; UE strengthening; standing     Beverly Gust, OTR/L

## 2018-08-09 NOTE — Progress Notes
OCCUPATIONAL THERAPY     08/09/18 1400   Precautions   L LE Precautions LLE Toe Touch Weight Bearing   Comments No active LLE abdcution   Subjective   Subjective I can't wipe myself when I'm toileting.   Grooming   Grooming - Wash Both Hands Assist No   Grooming Assist Stand By Assist   Grooming Position Sitting in chair   Grooming Comments setup assist for wipes   Lower Body Dressing   LE Dressing Assist Minimal Assist   Lower Dressing Position Sitting edge of bed   Lower Dressing Garments/Equipment Reacher   Lower Dressing Comments MinA to steady as pt reaches down to pull shorts up. Cues for use of reacher, compensatory strategies with doffing/donning of shorts.   Toileting   Toileting-Adjusting clothing BEFORE using toiet, commode, bedpan or urinal Assist No   Toileting-Wipe Self Assist Yes   Toileting-Adjust clothing AFTER using toilet, commode, bedpan or urinal Assist Yes   Toileting Assist Maximum Assist   Toileting Position Seated   Toileting Equipment Grab bar - right   Toileting Comments Pt requires assist for wiping due to R hand edema he states. Pt requires assist for safely reaching pants when pulling up. Requires use of grab bar to steady self. Initiated education re: toilet tongs for peri care ease.   Ecologist bar - right   Toilet Transfer Comments Steady assist to/from toilet.   Bed Mobility   Bed Mobility: Supine to sit assist level Minimum assistance   Bed Mobility: Supine to sit type of assist Assist with Left lower extremity;With head of bed flat   Bed Mobility: Sit to Supine Assist Level Minimum assistance   Bed Mobility: Sit to supine type of assist Assist with left lower extremity;With head of bed flat   Bed Mobility Comments Pt gets out of bed on R side and into bed on L side to avoid L hip abduction. Mom present and instructed on assisting with LLE during mobility.    Transfers   Transfer: Product/process development scientist: Sit to stand assist level Contact guard assistance   Transfer: Sit to stand type of assist For safety   Transfer: Stand to sit assist level Contact guard assistance   Transfer: Stand to sit type of assist For safety   Transfer: Stand pivot assist level Contact guard assistance   Transfer: Stand pivot type of assist For safety   Transfer Comment Pt ambulates from EOB<>bathroom, good adherence to LLE precautions, however, fatigues easily with functional mobility.   Activity   Therapeutic Activities LB dressing/toileting activities as described above. Review of use reacher/leg lifter as well as possible need for toilet tongs for pericare. Issued L isotoner closed finger glove to address R hand edema, and instructed on hand pumping for edema reduction. Pt practices retrieval of items at floor level with reacher, RW, no LOB and CGA.  Pt/family provided information re: walker basket and BSC.   Assessment   Assessment Pt demonstrating increased assist for toileting due to R hand edema and difficulty reaching buttocks and will benefit from instruction re: use of toilet tongs as well as edema reduction of R hand. Pt demonstrating good understanding of use of reacher for LB dressing tasks. Pt continues to fatigue easily with activity.   Plan   Plan Comments Toilet tongs for pericare; toileting; functional activity  in standing; R hand edema reduction     Beverly Gust, OTR/L
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
## 2018-08-09 NOTE — Progress Notes
Pt unable to void this shift  Pt straight cathed at 0600 and 1530.  At 1530 pt had 570 cloudy amber urine.  Pt had tried to urinae for 15 min before that.  Pt has been having loose stools today.    Lt hip dressing about 3/4 covered with drainage underneath.  Pt is having trouble keeping leg from turning out.
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
## 2018-08-09 NOTE — Progress Notes
Pt. Reported abd pressure and discomfort. Pt. last good BM was 08/03/18 per pt. report. Has been taking stool softeners, laxatives and MOM q 4 but no results. Pt. refused suppository stating he had one 08/07/18 with very small stool. Dr. Charissa Bash was notified. Fleet enema ordered x 1 and was administered 0630. Pt. was able to have a small BM. Will continue to monitor.

## 2018-08-10 LAB — CBC CELLULAR THERAPEUTICS
Lab: 7.5 g/dL — ABNORMAL LOW (ref 13.5–16.5)
Lab: 8.9 10*3/uL — ABNORMAL HIGH (ref >60)

## 2018-08-10 LAB — BASIC METABOLIC PANEL CELLULAR THERAPEUTICS: Lab: 137 MMOL/L — ABNORMAL LOW (ref 137–147)

## 2018-08-10 NOTE — Progress Notes
Discussed with night RN that pt's hip wound appeared to have some drainage, and to see if it has increased since yesterday.  Pt's ace wrap is also not around his hips like the patient reported it was yesterday.

## 2018-08-10 NOTE — Progress Notes
OT notified RN after morning therapy that pt appeared pale and diaphoretic. Accdg to OT, Pt's HR was at 140 and the rest of his vitals were WNL. No SOB but complains of 7 out of 10 pain on his left leg SI. Nurse gave pain med and did a bladder scan. After 10 minutes of rest, HR was still at 140. MD notified at around 1000.    At 1034, pt's HR was 127 and pt reported feeling better.
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
## 2018-08-10 NOTE — Progress Notes
OCCUPATIONAL THERAPY     08/10/18 1300   Subjective   Subjective So I can't even touch my (L) toe to the ground now?   Upper Body Dressing   UE Dressing Assist Stand By Assist   Upper Dressing Position Sitting edge of bed   Upper Dressing Comments setup assist for donning clean shirt seated EOB   Lower Body Dressing   LE Dressing Assist Stand By Assist   Lower Dressing Position Standing for ___% of task   Lower Dressing Garments/Equipment Reacher   Lower Dressing Comments With use of reacher, dons shorts with increased time, SBA when standing to pull up over hips/buttocks.   Bed Mobility   Bed Mobility: Supine to sit assist level Stand by assistance   Bed Mobility: Supine to sit type of assist Requires extra time;With head of bed flat   Bed Mobility: Sit to Supine Assist Level Minimum assistance   Bed Mobility: Sit to supine type of assist Assist with left lower extremity;Requires extra time;With head of bed flat   Bed Mobility Comments Supine end of session, all needs in reach, bed alarm active   Transfers   Transfer: Assistive Device Nurse, adult   Transfer: Sit to stand assist level Contact guard assistance   Transfer: Sit to stand type of assist For safety   Transfer: Stand to sit assist level Contact guard assistance   Transfer: Stand to sit type of assist For safety   Transfer: Stand pivot assist level Contact guard assistance   Transfer: Stand pivot type of assist For safety   Transfer Comment Performs sit<>stand transfers x 2 and ambulates from EOB<>hallway with RW support, CGA, good adherence to LLE NWB restrictions.   Activity   Therapeutic Activities Dressing tasks/functional mobility as described above. Review of updated LLE NWB restrictions.    Modalities Biofreeze to L lower trap region for pain relief.   Therapeutic Exercise Supine;Right upper extremity;Left upper extremity;Strengthening;ROM - active;Sustained stretch  (bicep curls x 20, 5# weight)   Assessment Assessment Pt with much decreased R hand edema following use of isotoner glove, allowing for more ease with toileting tasks. Nearing ModI for dressing tasks with use of AE as needed. Good adherence to LLE NWB restrictions. Continues to fatigue easily with short mobility bouts.   Plan   Plan Comments Functional activity in standing; independence with toileting; reacher for LB dressing ease.     Beverly Gust, OTR/L

## 2018-08-10 NOTE — Progress Notes
RT Adult Assessment Note    NAME:Kingsly Jeanbaptiste             MRN: [id number]             DOB:05/13/1985          AGE: 33 y.o.  ADMISSION DATE: 08/07/2018             DAYS ADMITTED: LOS: 3 days    RT Treatment Plan:  Protocol Plan: Medications  Albuterol: MDI PRN    Protocol Plan: Procedures  PAP: Place a nursing order for "IS Q1h While Awake" for any of Lung Expansion indicators  Oxygen/Humidity: O2 to keep SpO2 > 92%, if not on any RT modality, D/C protocol if greater than 24 hours on room air  SpO2: BID & PRN    Additional Comments:  Impressions of the patient:  alert  Intervention(s)/outcome(s):   Patient education that was completed:   Recommendations to the care team:     Vital Signs:  Pulse: (!) 127  RR: 18 PER MINUTE  SpO2: 98 %  O2 Device: Standby  Liter Flow:    O2%:    Breath Sounds: Clear (Implies normal)  Respiratory Effort: Non-Labored
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
## 2018-08-10 NOTE — Progress Notes
PHYSICAL THERAPY     08/10/18 0800   Precautions   L LE Precautions LLE Toe Touch Weight Bearing   Comments No active LLE abdcution   Subjective   Subjective Patient is agreeable to whatever gets him home the fastest   Bed Mobility   Bed Mobility: Supine to sit assist level Stand by assistance   Bed Mobility: Supine to sit type of assist No rail;With head of bed flat   Bed Mobility: Sit to Supine Assist Level Stand by assistance   Bed Mobility: Sit to supine type of assist No rail;With head of bed flat   Bed Mobility Comments Patient practiced bed mobility from therapy mat.  Patient uses leg lifter with verbal cues to scoot back and pivot into the bed.  Patient follows abduction precaution.  Patient when from supine to sit and back two supine 5 times.   Transfers   Transfer: Electrical engineer: Sit to stand assist level Contact guard assistance   Transfer: Sit to stand type of assist For safety   Transfer: Stand to sit assist level Contact guard assistance   Transfer: Stand to sit type of assist For safety   Transfer: Stand pivot assist level Contact guard assistance   Transfer: Stand pivot type of assist For safety   Gait   Gait Distance 60 feet  (50, 50 ,50)   GAIT: Assist Level Contact guard assistance   GAIT: Type of Assist For safety   Gait: Assistive Device Roller Walker   Assessment   Assessment Patient was able to preform bed mobility from therapy mat stand by assist.  Patient scoots back and pivots hips while using the left lifter to assist with LLE.    Plan   Comments Continue gait training with roller walker (reported axillary crutches were uncomfortable during last attempt), trial stairs with patient's arm around therapist shoulders simlating home level assist, continue active/active assistive LE ROM program.     Tonita Phoenix DPT, PT

## 2018-08-10 NOTE — Progress Notes
OCCUPATIONAL THERAPY     08/10/18 0942   Bed Mobility   Bed Mobility: Sit to Supine Assist Level Minimum assistance   Bed Mobility: Sit to supine type of assist With head of bed flat;Requires extra time;Assist with left lower extremity   Bed Mobility Comments Assist with LLE upon return to supine; transfers on L side of bed to avoid L hip abduction.   Transfers   Transfer: Electrical engineer: Sit to stand assist level Contact guard assistance   Transfer: Sit to stand type of assist For safety   Transfer: Stand to sit assist level Contact guard assistance   Transfer: Stand to sit type of assist For safety   Transfer: Stand pivot assist level Contact guard assistance   Transfer: Stand pivot type of assist For safety   Transfer Comment Pt ambulates in room and from room<>laundry room, CGA, RW support. Upon return to room, pt noted to be diaphoretic and pale. Pt returns to seated; vitals assessed - all WNL with exception of increased HR to 140. RN notified. After 10 min rest, HR remains at 140. RN to notify physicians.    Instrumental Acitivities of Daily Living (IADL)   IADL Home management   IADL Assist Level Stand by assist   IADL Cues SBA for laundry task to place laundry into machine/manage controls. Pt uses machine for support.   Assessment   Assessment Pt continues to fatigue easily with functional mobility, and requires CGA for safety with functional mobility/transfers. Pt demonstrates good understanding of use of toilet tongs for periocare with toileting.   Plan   Plan Comments Functional activity in standing; independence with toileting; reacher for LB dressing ease.     Marquette Saa, OTR/L

## 2018-08-11 LAB — CBC CELLULAR THERAPEUTICS
Lab: 2.3 M/UL — ABNORMAL LOW (ref 4.4–5.5)
Lab: 22 % — ABNORMAL LOW (ref 40–50)
Lab: 8 K/UL — ABNORMAL HIGH (ref 4.5–11.0)
Lab: 93 FL — ABNORMAL LOW (ref >60)

## 2018-08-11 MED ORDER — NYSTATIN 100,000 UNIT/GRAM TP POWD
Freq: Four times a day (QID) | TOPICAL | 0 refills | Status: DC
Start: 2018-08-11 — End: 2018-08-13
  Administered 2018-08-11: 18:00:00 via TOPICAL
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
## 2018-08-12 ENCOUNTER — Encounter: Admit: 2018-08-12 | Discharge: 2018-08-12

## 2018-08-12 LAB — BASIC METABOLIC PANEL CELLULAR THERAPEUTICS
Lab: 102 MMOL/L — ABNORMAL HIGH (ref 98–110)
Lab: 28 MMOL/L (ref 21–30)

## 2018-08-12 LAB — CBC CELLULAR THERAPEUTICS: Lab: 11 K/UL — ABNORMAL HIGH (ref >60)

## 2018-08-12 MED ORDER — SENNOSIDES-DOCUSATE SODIUM 8.6-50 MG PO TAB
2 | Freq: Two times a day (BID) | ORAL | 0 refills | Status: DC
Start: 2018-08-12 — End: 2018-09-12

## 2018-08-12 MED ORDER — CYCLOBENZAPRINE 10 MG PO TAB
10 mg | ORAL_TABLET | Freq: Three times a day (TID) | ORAL | 0 refills | 21.00000 days | Status: DC
Start: 2018-08-12 — End: 2018-08-13

## 2018-08-12 MED ORDER — MORPHINE 30 MG PO TAB
30 mg | ORAL_TABLET | ORAL | 0 refills | Status: CN | PRN
Start: 2018-08-12 — End: ?

## 2018-08-12 MED ORDER — ASCORBIC ACID (VITAMIN C) 500 MG PO TAB
500 mg | ORAL_TABLET | Freq: Every day | ORAL | 0 refills | 50.00000 days | Status: DC
Start: 2018-08-12 — End: 2018-08-27

## 2018-08-12 MED ORDER — ACETAMINOPHEN 500 MG PO TAB
1000 mg | ORAL | 0 refills | Status: AC | PRN
Start: 2018-08-12 — End: ?

## 2018-08-12 MED ORDER — MORPHINE 30 MG PO TAB
30 mg | ORAL_TABLET | ORAL | 0 refills | 7.00000 days | Status: DC | PRN
Start: 2018-08-12 — End: 2018-08-12

## 2018-08-12 MED ORDER — FERROUS GLUCONATE 324 MG (37.5 MG IRON) PO TAB
324 mg | Freq: Every day | ORAL | 0 refills | 90.00000 days | Status: DC
Start: 2018-08-12 — End: 2018-08-27

## 2018-08-12 MED ORDER — ENOXAPARIN 40 MG/0.4 ML SC SYRG
40 mg | Freq: Every day | SUBCUTANEOUS | 0 refills | 7.00000 days | Status: DC
Start: 2018-08-12 — End: 2018-08-13

## 2018-08-12 MED ORDER — MORPHINE 15 MG PO TAB
30 mg | ORAL | 0 refills | Status: DC | PRN
Start: 2018-08-12 — End: 2018-08-13
  Administered 2018-08-12 – 2018-08-13 (×5): 30 mg via ORAL

## 2018-08-12 MED ORDER — TAMSULOSIN 0.4 MG PO CAP
0.4 mg | ORAL_CAPSULE | Freq: Every day | ORAL | 1 refills | 90.00000 days | Status: DC
Start: 2018-08-12 — End: 2018-08-13

## 2018-08-12 MED ORDER — FOLIC ACID 1 MG PO TAB
1 mg | ORAL_TABLET | Freq: Every day | ORAL | 0 refills | Status: DC
Start: 2018-08-12 — End: 2018-08-13

## 2018-08-12 MED ORDER — POLYETHYLENE GLYCOL 3350 17 GRAM PO PWPK
17 g | Freq: Every day | ORAL | 0 refills | 18.00000 days | Status: DC
Start: 2018-08-12 — End: 2018-09-12

## 2018-08-12 MED ORDER — THIAMINE MONONITRATE (VIT B1) 100 MG PO TAB
100 mg | Freq: Every day | ORAL | 0 refills | 10.00000 days | Status: AC
Start: 2018-08-12 — End: ?

## 2018-08-12 MED ORDER — MORPHINE 30 MG PO TAB
30 mg | ORAL_TABLET | ORAL | 0 refills | 7.00000 days | Status: DC | PRN
Start: 2018-08-12 — End: 2018-08-27
  Filled 2018-08-12: qty 84, 14d supply, fill #1

## 2018-08-12 MED ORDER — NYSTATIN 100,000 UNIT/GRAM TP POWD
Freq: Every day | TOPICAL | 0 refills | 30.00000 days | Status: DC
Start: 2018-08-12 — End: 2018-08-13

## 2018-08-12 MED ORDER — MELATONIN 5 MG PO TAB
10 mg | Freq: Every evening | ORAL | 0 refills | 28.00000 days | Status: DC
Start: 2018-08-12 — End: 2018-08-27

## 2018-08-12 NOTE — Patient Education
Pt had been trying to void with the urinal but unable to do so. Nurse instructed the pt on several occassions on how to do straight catheterization explaining he needs to know how to do it just in case his bladder won't return to normal function before he gets discharged, but pt refuses every time.    Mom came to visit in the afternoon and nurse did another [facility] education on both of them. Mom is aware that son is refusing to learn self cathing. Nurse discussed the clean vs sterile method and showed mom how to do it. Mom was eager to learn and verbalized understanding of the procedure.

## 2018-08-12 NOTE — Progress Notes
Brief Orthopedic Update:    CT left lower extremity results reviewed and discussed with Dr. Renne Crigler.     1. Osteochondral fracture of the far lateral talar dome with 1 cm   minimally displaced fragment. Subtle cortical irregularity and slight   impaction of the adjacent far lateral tibial plafond (coronal 5, sagittal   15).  2. Comminuted fracture of the lateral cuneiform extending to the articular   surface of the third TMT.  3. Question tiny nondisplaced fracture of the superior aspect of the   cuboid.  4. Ankle mortise and tibiofibular syndesmosis are maintained.    Patient is to be non weightbearing on the left lower extremity for a total of six weeks     Rec lovenox for dvt ppx on dc x 3 weeks   Keep dressing  CDI until follow up. If drainage is present notify Dr. Renne Crigler office or on call resident if after hours at [phone]. If during business hours call [phone] [name] to heddings for questions concerns.   Follow up in 2-3 weeks from OR on 6/28 with Dr. Renne Crigler clinic, call [phone] to schedule this visit.Office located on 2nd floor of Orthopedic building. This can be accessed through the [place] parking garage off of [address]. Exact address is [address], [place] [address]  Call [facility] [phone] for orthopedic questions or concerns when outpatient.    Donnita Falls, PA-C  Orthopedic Surgery Physician Assistant  P. [phone]/[place]

## 2018-08-12 NOTE — Progress Notes
OCCUPATIONAL THERAPY     08/12/18 0930   Subjective   Subjective My mom is more worried than I am about going home - I feel like I'm ready   Bathing   Bath/Shower Soap and water shower/bath  (sponge bath seated at sink)   Bathing - Chest Assist No   Bathing - Left Arm Assist No   Bathing - Right Arm Assist No   Bathing - Abdomen Assist No   Bathing - Perineal Area Assist No   Bathing - Buttocks Assist No   Bathing - Right Upper Leg Assist No   Bathing - Right Lower Leg Including Foot Assist No   Bathing Assist Independent;Sponge Bath   Bathing Position Shower - sitting   Upper Body Dressing   UE Dressing Assist Independent   Upper Dressing Position Sitting in chair   Upper Dressing Comments Able to doff shirt without assistance while seated in wheelchair   Lower Body Dressing   LE Dressing Assist Modified Independent   Lower Dressing Position Sitting in chair   Lower Dressing Garments/Equipment Reacher   Lower Dressing Comments doffs shorts while seated in chair in preparation for sponge bath    Bed Mobility   Bed Mobility Comments From hospital bed, patient required minimal assistance with head of bed flat, use rail, and assist with LLE despite attempt to use leg lifter.  Patient able to manage better on therapy mat with getting LLE into bed first and out of bed last (different from hospital bed) at modified independent level with increased time and use of gait belt as leg lifter   Transfers   Transfer: Assistive Device Nurse, adult   Transfer: Sit to stand assist level Stand by assistance   Transfer: Sit to stand type of assist For safety   Transfer: Stand to sit assist level Stand by assistance   Transfer: Stand to sit type of assist For safety   Transfer: Stand pivot assist level Stand by assistance   Transfer: Stand pivot type of assist For safety   Transfer Comment ambulated short distance in room and completed x3 stand pivot transfers with roller walker   Activity Therapeutic Activities Discussed mobility within home including transporting items from kitchen to bed / living room when mom or friend are not present.  Patient acknowledged understanding.  Worked on managing LLE and wheelchair leg rest - completed with only verbal cues and utilized gait belt as leg lifter for LLE.   Assessment   Assessment Patient continues to make gains toward modified independence with ADLs and related transfers in order to transition home at least burden of care   Plan   Plan Comments Functional activity in standing; independence with toileting; reacher for LB dressing ease   Recommendations   OT Discharge Recommendations Home with family assist;Available help at home   OT Equipment Recommendations Commode   Comments Recommend patient has bedside commode upon discharge home due to patient will need to stay on main level of home without access to a bathroom / standard toilet.   Expected Discharge Date 08/13/18     Arlyss Gandy, OTR/L

## 2018-08-12 NOTE — Progress Notes
Foley placed per pt request today before DC tomorrow. Educated pt on urine color, cleaning, emptying, and overall care of foley. Pt and mom verbalized understanding.

## 2018-08-12 NOTE — Progress Notes
OCCUPATIONAL THERAPY     08/12/18 1130   Subjective   Subjective "This foley isn't very comfortable but I don't want to straight cath myself"   Transfers   Transfer: Programmer, applications   Transfer: Sit to stand assist level Modified independent   Transfer: Sit to stand type of assist For safety   Transfer: Stand to sit assist level Modified independent   Transfer: Stand to sit type of assist For safety   Transfer: Stand pivot assist level Modified independent   Transfer: Stand pivot type of assist For safety   Activity   Therapeutic Activities Transferred items from washer to dry in standing.     Therapeutic Exercise Seated;Strengthening  (5 pound weighted bar, edge of mat to engage core)   Assessment   Assessment Patient demonstrating modified independence with transfers with increased time to complete.  Patient requires some encouragement for more independence with managing LLE for wheelchair and in / out of bed.  Able to manage ADLs and commode transfers at home with main barrier being ability to manage steps into home   Plan   Plan Comments Discharge home 7/7   Weekly Goals   Patient Will Perform LE Dressing w/ Modified Independent;w/ Adaptive Equipment;Met   Patient Will Perform Toileting w/ Bedside Commode;w/ Modified Independent;Met   Pt Will Transfer To Bedside Commode Modified Independent;Met   Goals for the stay   Pt will perform basic care and transfer with Modified independence;Roller walker level;Adequate for discharge     Trellis Moment, OTR/L

## 2018-08-12 NOTE — Progress Notes
PHYSICAL THERAPY     08/12/18 0830   Precautions   L LE Precautions LLE Non-Weight Bearing   Comments no active LLE abduction   Subjective   Subjective Pt sitting up in w/c, pt's mom present in room.  Reviewed DC plans/recommendations for equipment and support at DC   Transfers   Transfer: Hospital doctor   Transfer: Sit to stand assist level Modified independent   Transfer: Sit to stand type of assist For safety   Transfer: Stand to sit assist level Modified independent   Transfer: Stand to sit type of assist For safety   Transfer: Stand pivot assist level Modified independent   Transfer: Stand pivot type of assist For safety   Transfer Comment Multiple transfers completed during stair training.  Trialed transfers with use of crutches and pt able to complete with minimal assist, decreased stability with crutch use today.   Stairs   Stair: Assist Level Moderate assistance   Stairs: Number Climbed 2   Stairs: Hospital doctor;Axillary Crutches   Stairs Activity Limited By Weakness  (WBing status)   Stairs Comment Blocked stair training performed: First attempt going up/down with RW (backward up/forward down) with minimal-moderate assist for safety and balance.  Trial up/down with use of crutches x 2 trials (going up backward first trial and then going up forward second trial)- pt required moderate assist for safety with initial attempt, improved safety going up forward and with practice only requiring minimal assist with practice.  Unclear if pt will have access to crutches at DC, will follow up.   Wheelchair   Wheelchair: Sales executive: Assistance Level Modified independent (device)   Wheelchair: Propulsion Method Bilateral upper extremity;Manual wheelchair   Assessment   Assessment Majority of session spent stair training and educating pt's mom on current functional status with stair mobility.  Mom in agreement to perform family training at next PT session this PM to review stair technique.  Pt initially reporting no plans to obtain roller walker at DC however PT education pt and pt's mom in importance of roller walker at DC for in home mobility and pt in agreement to purchase for DC.   Plan   Comments stair mobility- complete family training on steps, DC FIMS, IRF PAI   Recommendations   PT Discharge Recommendations Home;Home Health Setting   Equipment Recommendations Wheelchair - Manual;Wheelchair cushion;Walker - Roller;Commode   Expected Discharge Date 08/13/18

## 2018-08-12 NOTE — Progress Notes
PHYSICAL THERAPY     08/12/18 1300   Vitals*   Pulse (!) 130  (upon completion of )   BP 130/70  (assessed at end of treatment session)   Intensity Pain   Intensity Pain Scale (Self Report) 6   Words to Describe Pain Aching;Throbbing   Location of Pain Leg   Location Pain Orientation Left   Duration of Pain Constant   Precautions   L LE Precautions LLE Non-Weight Bearing   Comments no active LLE abduction   Subjective   Subjective Pt supine in bed, mom at bedside.  Pt requesting to change into clean clothes prior to start of session   Bed Mobility   Bed Mobility: Supine to sit assist level Stand by assistance   Bed Mobility: Supine to sit type of assist Follows precautions;With head of bed flat   Bed Mobility: Sit to Supine Assist Level Minimum assistance   Bed Mobility: Sit to supine type of assist Assist with left lower extremity;With head of bed flat   Bed Mobility Comments Minimal assist to lift L LE into bed due to fatigue/pain.  Pt is unsuccessful with attempt to use leg lifter.  Per OT report,  pt able to perform at a modified independent level when getting into bed form the R side.   Transfers   Transfer: Product/process development scientist: Sit to stand assist level Modified independent   Transfer: Sit to stand type of assist For safety   Transfer: Stand to sit assist level Modified independent   Transfer: Stand to sit type of assist For safety   Transfer: Stand pivot assist level Minimum assistance   Transfer: Stand pivot type of assist For safety   Transfer Comment Pt with lateral LOB while pivoting bed to w/c requiring minimal assist to safely correct   Daily Care   P.O. 240 mL   Gait   Gait Distance 20 feet   GAIT: Assist Level Stand by assistance   GAIT: Type of Assist For safety   Gait: Assistive Device Roller Walker   Gait Comment Pt declines further gait distances today due to fatigue from stair training.   Stairs   Stair: Assist Level Minimum assistance   Stairs: Number Climbed 3 Stairs: Assistive Device [employer]   Stairs Activity Limited By Weakness;Complaint of fatigue   Stairs Comment Pt performs stairs using roller walker: going up backward and down forward with good NWBing technique.  He takes first step with standard roller walker leg adjustments and consecutive steps with modified walker leg adjustments (extended front legs, shortened rear legs to minic stair as pt does not have railings at home steps) all with minimal assist for up and down.  Mother is present and performs stairs and walker leg adjustments with pt with minimal assist for safety and assist of therapist for pt stability in single leg stance while mom adjusts walker legs intermittently during session.  Per pt and pt's mom, second person will be present during stair mobility at home at DC.  Mom is safely able to assist pt with stair mobility this session however do recommend second person with stair mobility at DC for additional safety/assist.     Wheelchair   Wheelchair: Distance 652 feet  (max distance)   Wheelchair: Assistance Level Modified independent (device)   Wheelchair: Propulsion Method Bilateral upper extremity   Wheelchair Comments HR 130bpm upon completion of w/c mobility   Outcome Measures   6 Minute Walk: Feet in 6 Minutes  652   6 Minute Walk: Device Used performed at w/c level, using B UEs   Activity   PT Therapeutic Activities Majority of session spent with family training for stair mobility with pt's mother, bed mobility, transfers, w/c prop during   Assessment   Assessment Pt's mom present for family training this afternoon for stair mobility.  Per pt report, he will only have access to a roller walker at DC, therefore training performed with roller walker (pt with 3 steps to enter without rails) with pt's mother. With education, she is safely able to assist pt with stair mobility and reports a second person will also be present upon DC for stair mobility assist.  Pt and pt's mother report no concerns over anticipated DC tomorrow back to home   Plan   Comments planned DC 08/13/18, DC FIMS, IRF PAI   Recommendations   PT Discharge Recommendations Home with Assistance;Home Health Setting   Equipment Recommendations Wheelchair - Manual;Wheelchair cushion;Walker - Roller   Expected Discharge Date 08/13/18   Weekly Goals   Patient will perform sit to supine with Modified independent;Progressing;Adequate for discharge   Patient will perform supine to sit with Modified independent;Progressing;Adequate for discharge   Patient will complete sit to stand transfer with Modified independent;Achieved   Patient will complete stand to sit transfer with Modified independent;Achieved   Patient will complete stand pivot transfer with Modified independent;Progressing;Adequate for discharge   Patient will ambulate Progressing;Adequate for discharge  (72feet modified indepencen)   Patient will ascend/descend 4 stairs;Minimum assistance;No rail;Adequate for discharge  (achieved at 3 steps)   Goal(s) for the Stay   Patient will perform household mobility;community mobility;at wheelchair level;Modified independent;Adequate for discharge

## 2018-08-13 ENCOUNTER — Encounter: Admit: 2018-08-13 | Discharge: 2018-08-13

## 2018-08-13 ENCOUNTER — Inpatient Hospital Stay: Admit: 2018-08-08 | Discharge: 2018-08-08

## 2018-08-13 ENCOUNTER — Inpatient Hospital Stay: Admit: 2018-08-07 | Discharge: 2018-08-13

## 2018-08-13 DIAGNOSIS — F4322 Adjustment disorder with anxiety: Secondary | ICD-10-CM

## 2018-08-13 DIAGNOSIS — G4730 Sleep apnea, unspecified: Secondary | ICD-10-CM

## 2018-08-13 DIAGNOSIS — Z6841 Body Mass Index (BMI) 40.0 and over, adult: Secondary | ICD-10-CM

## 2018-08-13 DIAGNOSIS — R269 Unspecified abnormalities of gait and mobility: Secondary | ICD-10-CM

## 2018-08-13 DIAGNOSIS — Z9181 History of falling: Secondary | ICD-10-CM

## 2018-08-13 DIAGNOSIS — K760 Fatty (change of) liver, not elsewhere classified: Secondary | ICD-10-CM

## 2018-08-13 DIAGNOSIS — G8918 Other acute postprocedural pain: Secondary | ICD-10-CM

## 2018-08-13 DIAGNOSIS — G479 Sleep disorder, unspecified: Secondary | ICD-10-CM

## 2018-08-13 DIAGNOSIS — R339 Retention of urine, unspecified: Secondary | ICD-10-CM

## 2018-08-13 DIAGNOSIS — Z7409 Other reduced mobility: Secondary | ICD-10-CM

## 2018-08-13 DIAGNOSIS — G8911 Acute pain due to trauma: Secondary | ICD-10-CM

## 2018-08-13 DIAGNOSIS — D62 Acute posthemorrhagic anemia: Secondary | ICD-10-CM

## 2018-08-13 DIAGNOSIS — F17210 Nicotine dependence, cigarettes, uncomplicated: Secondary | ICD-10-CM

## 2018-08-13 DIAGNOSIS — F17213 Nicotine dependence, cigarettes, with withdrawal: Secondary | ICD-10-CM

## 2018-08-13 DIAGNOSIS — S72302D Unspecified fracture of shaft of left femur, subsequent encounter for closed fracture with routine healing: Secondary | ICD-10-CM

## 2018-08-13 DIAGNOSIS — I10 Essential (primary) hypertension: Secondary | ICD-10-CM

## 2018-08-13 DIAGNOSIS — K219 Gastro-esophageal reflux disease without esophagitis: Secondary | ICD-10-CM

## 2018-08-13 DIAGNOSIS — M21372 Foot drop, left foot: Secondary | ICD-10-CM

## 2018-08-13 DIAGNOSIS — F845 Asperger's syndrome: Secondary | ICD-10-CM

## 2018-08-13 DIAGNOSIS — F1010 Alcohol abuse, uncomplicated: Secondary | ICD-10-CM

## 2018-08-13 DIAGNOSIS — R531 Weakness: Secondary | ICD-10-CM

## 2018-08-13 DIAGNOSIS — S72112D Displaced fracture of greater trochanter of left femur, subsequent encounter for closed fracture with routine healing: Secondary | ICD-10-CM

## 2018-08-13 DIAGNOSIS — K5900 Constipation, unspecified: Secondary | ICD-10-CM

## 2018-08-13 MED ORDER — ENOXAPARIN 40 MG/0.4 ML SC SYRG
40 mg | Freq: Every day | SUBCUTANEOUS | 0 refills | 7.00000 days | Status: AC
Start: 2018-08-13 — End: ?

## 2018-08-13 MED ORDER — CYCLOBENZAPRINE 10 MG PO TAB
10 mg | ORAL_TABLET | Freq: Three times a day (TID) | ORAL | 0 refills | 21.00000 days | Status: DC
Start: 2018-08-13 — End: 2018-09-12

## 2018-08-13 MED ORDER — FOLIC ACID 1 MG PO TAB
1 mg | ORAL_TABLET | Freq: Every day | ORAL | 0 refills | Status: DC
Start: 2018-08-13 — End: 2018-09-12

## 2018-08-13 MED ORDER — TAMSULOSIN 0.4 MG PO CAP
.4 mg | ORAL_CAPSULE | Freq: Every day | ORAL | 1 refills | 90.00000 days | Status: DC
Start: 2018-08-13 — End: 2018-08-13

## 2018-08-13 MED ORDER — TAMSULOSIN 0.4 MG PO CAP
.4 mg | ORAL_CAPSULE | Freq: Every day | ORAL | 1 refills | 90.00000 days | Status: DC
Start: 2018-08-13 — End: 2018-08-27

## 2018-08-13 MED ORDER — ENOXAPARIN 40 MG/0.4 ML SC SYRG
40 mg | Freq: Every day | SUBCUTANEOUS | 0 refills | 7.00000 days | Status: DC
Start: 2018-08-13 — End: 2018-08-13

## 2018-08-13 MED ORDER — NYSTATIN 100,000 UNIT/GRAM TP POWD
Freq: Every day | TOPICAL | 0 refills | 30.00000 days | Status: DC
Start: 2018-08-13 — End: 2018-08-13

## 2018-08-13 MED ORDER — CYCLOBENZAPRINE 10 MG PO TAB
10 mg | ORAL_TABLET | Freq: Three times a day (TID) | ORAL | 0 refills | 30.00000 days | Status: DC
Start: 2018-08-13 — End: 2018-08-13

## 2018-08-13 MED ORDER — NYSTATIN 100,000 UNIT/GRAM TP POWD
Freq: Every day | TOPICAL | 0 refills | 30.00000 days | Status: DC
Start: 2018-08-13 — End: 2018-09-12

## 2018-08-13 NOTE — Discharge Planning (AHS/AVS)
NEMT stands for Non-Emergency Medical Transportation. NEMT can be used when you do not have a way to get to your healthcare appointment without charge. If approved by [facility], your transportation to your healthcare appointment may be public transportation or bus tokens, vans or taxi. [employer] will give you a ride that meets your needs. You do not get to choose what kind of car or Zenaida Niece or the company that will give you the ride. You may be able to get help with gas costs if you have a friend or a neighbor who can take you. This must be approved before your appointment.  Who can get NEMT Services?  ? You must be a member of [employer] on the day of your appointment.  ? Some people do not get NEMT as part of their benefits. To check, call Customer Service at [phone] (TDD/TTY: [phone]).  ? Children who are under age 69 must have an adult ride with them.  ? We will only pay for one child and one parent/guardian and/or an attendant if your child is under age 82 and needs to be away from home overnight or needs someone to be with him/her. We will not pay for other children or adults.  ? We will pay for an attendant to go with a member with a disability.  ? Volunteer, community, or other ancillary services are available at no cost to you.  To what healthcare services can I get NEMT to take me?  ? The appointment is to a healthcare provider that is in [facility] network.  ? The appointment is to a service covered by [facility].  ? The appointment is to a healthcare provider near where you live. If the provider is far away, you may need to say why and get a note from your PCP. There are rules about how far you can travel to a healthcare appointment and get a ride.  ? Some services already include NEMT. We will not give you a ride to these services. Examples are: Some hospice services; Developmental Disability (DD) Waiver services; some Community Psychiatric Rehabilitation (CPR) services; adult day healthcare services; and services provided in your home. School districts must supply a ride to a child???s Individual Education Plan (IEP) services and IEP medical related services.  ? The NEMT program can take you to a durable medical equipment (DME) provider only if the DME provider cannot mail or deliver your equipment to you.  How do I use the NEMT program?  Call [facility] Customer Service at [phone] (TDD/TTY: [phone]). You must call at least three days before the day of the appointment or you may not get NEMT. You may be able to get a ride sooner if your healthcare provider gives you an urgent care appointment. If you have an emergency, dial 911, or the local emergency phone number.  [employer] Plan covers ambulance transportation to the nearest hospital for emergency situations. You do not need to contact [facility] before using an ambulance for emergencies.   Ambulance transportation to the hospital emergency room in non-emergency situations is not covered by [facility].  Ambulance transportation from a healthcare facility to another healthcare facility is covered only when it is medically necessary. It also has to be arranged and approved by [facility] network provider

## 2018-08-13 NOTE — Case Management (ED)
CMA note:    Per [employer] transportation, driver will pick up patient today in wheelchair [place] and [place].    For questions regarding this ride call [phone].    Sharla Kidney  Case Primary school teacher

## 2018-08-13 NOTE — Progress Notes
Paul [name] discharged on 08/13/2018.   Marland Kitchen  Discharge instructions reviewed with patient.  Valuables returned:   Personal Items / Valuables: Location manager Devices Type: Wheelchair  Where Are Valuables Stored?: sent home with pt.  Home medications:    .  Functional assessment at discharge complete: Yes .    Pt was assisted out of the unit at around 1600. AVS dicussed with patient and all belongings sent home with him.

## 2018-08-21 ENCOUNTER — Encounter: Admit: 2018-08-21 | Discharge: 2018-08-21

## 2018-08-21 NOTE — Telephone Encounter
rec'd message from pt [facility] PT amy robertson @[phone] that pt was all set up for his initial eval, she spoke to pt on phone and he was ready to start PT, but then he texted her saying he no longer wants [facility] services and then blocked her number.    LVM for pt inquiring about why he is refusing PT HH at this time.

## 2018-08-27 ENCOUNTER — Encounter: Admit: 2018-08-27 | Discharge: 2018-08-27

## 2018-08-27 ENCOUNTER — Ambulatory Visit: Admit: 2018-08-27 | Discharge: 2018-08-28

## 2018-08-27 DIAGNOSIS — K929 Disease of digestive system, unspecified: Secondary | ICD-10-CM

## 2018-08-27 DIAGNOSIS — I10 Essential (primary) hypertension: Secondary | ICD-10-CM

## 2018-08-27 MED ORDER — MORPHINE 30 MG PO TAB
30 mg | ORAL_TABLET | ORAL | 0 refills | 7.00000 days | Status: DC | PRN
Start: 2018-08-27 — End: 2018-11-07

## 2018-08-27 MED ORDER — RIVAROXABAN 10 MG PO TAB
10 mg | ORAL_TABLET | Freq: Every day | ORAL | 0 refills | 30.00000 days | Status: DC
Start: 2018-08-27 — End: 2018-09-19
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
## 2018-09-05 ENCOUNTER — Encounter: Admit: 2018-09-05 | Discharge: 2018-09-05

## 2018-09-05 MED ORDER — HYDROCODONE-ACETAMINOPHEN 5-325 MG PO TAB
1-2 | ORAL_TABLET | ORAL | 0 refills | 15.00000 days | Status: DC | PRN
Start: 2018-09-05 — End: 2018-09-12

## 2018-09-11 ENCOUNTER — Encounter: Admit: 2018-09-11 | Discharge: 2018-09-11

## 2018-09-11 DIAGNOSIS — S72002A Fracture of unspecified part of neck of left femur, initial encounter for closed fracture: Secondary | ICD-10-CM

## 2018-09-12 ENCOUNTER — Ambulatory Visit: Admit: 2018-09-12 | Discharge: 2018-09-12

## 2018-09-12 ENCOUNTER — Encounter: Admit: 2018-09-12 | Discharge: 2018-09-12

## 2018-09-12 DIAGNOSIS — S72352D Displaced comminuted fracture of shaft of left femur, subsequent encounter for closed fracture with routine healing: Secondary | ICD-10-CM

## 2018-09-12 DIAGNOSIS — M958 Other specified acquired deformities of musculoskeletal system: Secondary | ICD-10-CM

## 2018-09-12 DIAGNOSIS — I10 Essential (primary) hypertension: Secondary | ICD-10-CM

## 2018-09-12 DIAGNOSIS — K929 Disease of digestive system, unspecified: Secondary | ICD-10-CM

## 2018-09-12 DIAGNOSIS — S72002A Fracture of unspecified part of neck of left femur, initial encounter for closed fracture: Principal | ICD-10-CM

## 2018-09-12 MED ORDER — HYDROCODONE-ACETAMINOPHEN 5-325 MG PO TAB
1 | ORAL_TABLET | ORAL | 0 refills | 30.00000 days | Status: AC | PRN
Start: 2018-09-12 — End: ?

## 2018-09-12 MED ORDER — CYCLOBENZAPRINE 10 MG PO TAB
10 mg | ORAL_TABLET | Freq: Three times a day (TID) | ORAL | 0 refills | 30.00000 days | Status: DC
Start: 2018-09-12 — End: 2018-10-31
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
## 2018-09-19 ENCOUNTER — Encounter: Admit: 2018-09-19 | Discharge: 2018-09-19

## 2018-09-19 MED ORDER — RIVAROXABAN 10 MG PO TAB
10 mg | ORAL_TABLET | Freq: Every day | ORAL | 0 refills | 30.00000 days | Status: DC
Start: 2018-09-19 — End: 2018-10-31

## 2018-10-28 ENCOUNTER — Encounter: Admit: 2018-10-28 | Discharge: 2018-10-28 | Payer: MEDICARE

## 2018-10-31 ENCOUNTER — Encounter: Admit: 2018-10-31 | Discharge: 2018-10-31 | Payer: MEDICARE

## 2018-10-31 MED ORDER — RIVAROXABAN 10 MG PO TAB
10 mg | ORAL_TABLET | Freq: Every day | ORAL | 0 refills | 30.00000 days | Status: DC
Start: 2018-10-31 — End: 2018-11-18

## 2018-10-31 MED ORDER — CYCLOBENZAPRINE 10 MG PO TAB
10 mg | ORAL_TABLET | Freq: Three times a day (TID) | ORAL | 0 refills | 21.00000 days | Status: AC
Start: 2018-10-31 — End: ?

## 2018-10-31 NOTE — Telephone Encounter
Paul [name] RE  RF blood thinner and muscle relaxer , [facility] [name], wants someone to contact PT [facility] hospital amount weight can put on leg. [phone]. Her home [phone]    Called PT- Pt has only attended 3 PT sessions since 8/6. Fell 2 weeks ago and ER told him to be NWB until fu w/ [place] even though no inc pain or new injuries seen on imaging. PT called to clarify this. Denies pt reports increased pain at fx site. Pt still at only 25% WB.  Advised to continue 25% WB per pt tolerance for now and increasing 25% weekly per the plan. Has fu w/ [place] next Thursday 10/1.She states understanding.    Called [name]- confirmed call placed to PT to confirm wb status and meds RF'd as requested.

## 2018-11-07 ENCOUNTER — Ambulatory Visit: Admit: 2018-11-07 | Discharge: 2018-11-07 | Payer: MEDICARE

## 2018-11-07 ENCOUNTER — Encounter: Admit: 2018-11-07 | Discharge: 2018-11-07 | Payer: MEDICARE

## 2018-11-07 DIAGNOSIS — S82892D Other fracture of left lower leg, subsequent encounter for closed fracture with routine healing: Secondary | ICD-10-CM

## 2018-11-07 DIAGNOSIS — K929 Disease of digestive system, unspecified: Secondary | ICD-10-CM

## 2018-11-07 DIAGNOSIS — S72352D Displaced comminuted fracture of shaft of left femur, subsequent encounter for closed fracture with routine healing: Secondary | ICD-10-CM

## 2018-11-07 DIAGNOSIS — I10 Essential (primary) hypertension: Secondary | ICD-10-CM

## 2018-11-07 DIAGNOSIS — S72002D Fracture of unspecified part of neck of left femur, subsequent encounter for closed fracture with routine healing: Secondary | ICD-10-CM

## 2018-11-07 DIAGNOSIS — M958 Other specified acquired deformities of musculoskeletal system: Secondary | ICD-10-CM

## 2018-11-07 DIAGNOSIS — S72002A Fracture of unspecified part of neck of left femur, initial encounter for closed fracture: Secondary | ICD-10-CM
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
## 2018-11-18 ENCOUNTER — Encounter: Admit: 2018-11-18 | Discharge: 2018-11-18 | Payer: MEDICARE

## 2018-11-18 MED ORDER — XARELTO 10 MG PO TAB
ORAL_TABLET | Freq: Every day | ORAL | 0 refills | 30.00000 days | Status: AC
Start: 2018-11-18 — End: ?

## 2018-11-25 ENCOUNTER — Encounter: Admit: 2018-11-25 | Discharge: 2018-11-25 | Payer: MEDICARE

## 2018-11-25 NOTE — Telephone Encounter
[name] LVM [phone] RE questions regarding patient Paul [name].    Left return VM stating ok to leave questions on this RN's secure VM.

## 2018-12-06 ENCOUNTER — Encounter: Admit: 2018-12-06 | Discharge: 2018-12-06 | Payer: MEDICARE

## 2018-12-18 ENCOUNTER — Encounter: Admit: 2018-12-18 | Discharge: 2018-12-18 | Payer: MEDICARE

## 2018-12-18 DIAGNOSIS — S72352D Displaced comminuted fracture of shaft of left femur, subsequent encounter for closed fracture with routine healing: Secondary | ICD-10-CM

## 2018-12-18 DIAGNOSIS — S72002D Fracture of unspecified part of neck of left femur, subsequent encounter for closed fracture with routine healing: Secondary | ICD-10-CM

## 2018-12-18 DIAGNOSIS — S82892D Other fracture of left lower leg, subsequent encounter for closed fracture with routine healing: Secondary | ICD-10-CM
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
## 2022-01-29 ENCOUNTER — Inpatient Hospital Stay: Admit: 2022-01-29 | Discharge: 2022-01-29 | Payer: MEDICARE

## 2022-01-29 ENCOUNTER — Inpatient Hospital Stay: Admit: 2022-01-29 | Payer: MEDICARE

## 2022-01-29 ENCOUNTER — Encounter: Admit: 2022-01-29 | Discharge: 2022-01-29 | Payer: MEDICARE

## 2022-01-29 DIAGNOSIS — T1490XA Injury, unspecified, initial encounter: Secondary | ICD-10-CM

## 2022-01-29 DIAGNOSIS — I10 Essential (primary) hypertension: Secondary | ICD-10-CM

## 2022-01-29 DIAGNOSIS — S73005A Unspecified dislocation of left hip, initial encounter: Secondary | ICD-10-CM

## 2022-01-29 DIAGNOSIS — K929 Disease of digestive system, unspecified: Secondary | ICD-10-CM

## 2022-01-29 LAB — MAGNESIUM: MAGNESIUM: 1.8 mg/dL (ref 1.6–2.6)

## 2022-01-29 LAB — PHOSPHORUS: PHOSPHORUS: 3.1 mg/dL (ref 2.0–4.5)

## 2022-01-29 LAB — COMPREHENSIVE METABOLIC PANEL
ALBUMIN: 3.9 g/dL (ref 3.5–5.0)
ALK PHOSPHATASE: 76 U/L (ref 25–110)
ALT: 29 U/L (ref 7–56)
ANION GAP: 11 (ref 3–12)
AST: 25 U/L (ref 7–40)
CALCIUM: 7.8 mg/dL — ABNORMAL LOW (ref 8.5–10.6)
CO2: 22 MMOL/L (ref 21–30)
CREATININE: 0.6 mg/dL (ref 0.4–1.24)
EGFR: >60 mL/min (ref >60)
SODIUM: 142 MMOL/L (ref 137–147)
TOTAL BILIRUBIN: 0.3 mg/dL (ref 0.3–1.2)
TOTAL PROTEIN: 6.6 g/dL (ref 6.0–8.0)

## 2022-01-29 LAB — CBC: WBC COUNT: 11 K/UL — ABNORMAL HIGH (ref 4.5–11.0)

## 2022-01-29 LAB — PTT (APTT): PTT: 25 s (ref 24.0–36.5)

## 2022-01-29 LAB — PROTIME INR (PT): PROTIME: 11 s (ref 9.5–14.2)

## 2022-01-29 MED ORDER — ARTIFICIAL TEARS (PF) SINGLE DOSE DROPS GROUP
OPHTHALMIC | 0 refills | Status: DC
Start: 2022-01-29 — End: 2022-01-29

## 2022-01-29 MED ORDER — FENTANYL CITRATE (PF) 50 MCG/ML IJ SOLN
INTRAVENOUS | 0 refills | Status: DC
Start: 2022-01-29 — End: 2022-01-29

## 2022-01-29 MED ORDER — SODIUM CHLORIDE 0.9 % IJ SOLN
50 mL | Freq: Once | INTRAVENOUS | 0 refills | Status: CP
Start: 2022-01-29 — End: ?
  Administered 2022-01-29: 18:00:00 50 mL via INTRAVENOUS

## 2022-01-29 MED ORDER — ACETAMINOPHEN 500 MG PO TAB
1000 mg | ORAL | 0 refills | Status: AC
Start: 2022-01-29 — End: ?
  Administered 2022-01-29 – 2022-02-01 (×9): 1000 mg via ORAL

## 2022-01-29 MED ORDER — ACETAMINOPHEN 325 MG PO TAB
650 mg | ORAL | 0 refills | Status: DC | PRN
Start: 2022-01-29 — End: 2022-01-29
  Administered 2022-01-29: 11:00:00 650 mg via ORAL

## 2022-01-29 MED ORDER — OXYCODONE 5 MG PO TAB
5-10 mg | ORAL | 0 refills | Status: DC | PRN
Start: 2022-01-29 — End: 2022-01-29
  Administered 2022-01-29: 11:00:00 10 mg via ORAL

## 2022-01-29 MED ORDER — MULTIVITAMIN PO TAB
1 | Freq: Every day | ORAL | 0 refills | Status: AC
Start: 2022-01-29 — End: ?
  Administered 2022-01-29 – 2022-02-01 (×4): 1 via ORAL

## 2022-01-29 MED ORDER — SENNOSIDES-DOCUSATE SODIUM 8.6-50 MG PO TAB
2 | Freq: Two times a day (BID) | ORAL | 0 refills | Status: DC
Start: 2022-01-29 — End: 2022-01-29

## 2022-01-29 MED ORDER — POLYETHYLENE GLYCOL 3350 17 GRAM PO PWPK
1 | Freq: Every day | ORAL | 0 refills | Status: AC
Start: 2022-01-29 — End: ?
  Administered 2022-02-01: 15:00:00 17 g via ORAL

## 2022-01-29 MED ORDER — SUGAMMADEX 100 MG/ML IV SOLN
INTRAVENOUS | 0 refills | Status: DC
Start: 2022-01-29 — End: 2022-01-29

## 2022-01-29 MED ORDER — ONDANSETRON HCL (PF) 4 MG/2 ML IJ SOLN
4 mg | Freq: Once | INTRAVENOUS | 0 refills | Status: DC | PRN
Start: 2022-01-29 — End: 2022-01-29

## 2022-01-29 MED ORDER — FENTANYL CITRATE (PF) 50 MCG/ML IJ SOLN
12.5 ug | INTRAVENOUS | 0 refills | Status: DC | PRN
Start: 2022-01-29 — End: 2022-01-29

## 2022-01-29 MED ORDER — VANCOMYCIN 1,000 MG IV SOLR
0 refills | Status: DC
Start: 2022-01-29 — End: 2022-01-29

## 2022-01-29 MED ORDER — DEXMEDETOMIDINE IN 0.9 % NACL 20 MCG/5 ML (4 MCG/ML) IV SYRG
INTRAVENOUS | 0 refills | Status: DC
Start: 2022-01-29 — End: 2022-01-29

## 2022-01-29 MED ORDER — IPRATROPIUM-ALBUTEROL 0.5 MG-3 MG(2.5 MG BASE)/3 ML IN NEBU
3 mL | Freq: Once | RESPIRATORY_TRACT | 0 refills | Status: DC | PRN
Start: 2022-01-29 — End: 2022-01-29

## 2022-01-29 MED ORDER — POTASSIUM CHLORIDE 20 MEQ PO TBTQ
40 meq | Freq: Once | ORAL | 0 refills | Status: CP
Start: 2022-01-29 — End: ?
  Administered 2022-01-29: 18:00:00 40 meq via ORAL

## 2022-01-29 MED ORDER — HYDROMORPHONE (PF) 2 MG/ML IJ SYRG
.5 mg | INTRAVENOUS | 0 refills | Status: DC | PRN
Start: 2022-01-29 — End: 2022-01-29
  Administered 2022-01-29: 17:00:00 0.5 mg via INTRAVENOUS

## 2022-01-29 MED ORDER — SENNOSIDES 8.6 MG PO TAB
2 | Freq: Two times a day (BID) | ORAL | 0 refills | Status: AC
Start: 2022-01-29 — End: ?
  Administered 2022-01-30 – 2022-02-01 (×3): 2 via ORAL

## 2022-01-29 MED ORDER — SENNOSIDES-DOCUSATE SODIUM 8.6-50 MG PO TAB
1 | Freq: Every day | ORAL | 0 refills | Status: DC | PRN
Start: 2022-01-29 — End: 2022-01-29

## 2022-01-29 MED ORDER — MIDAZOLAM 1 MG/ML IJ SOLN
INTRAVENOUS | 0 refills | Status: DC
Start: 2022-01-29 — End: 2022-01-29

## 2022-01-29 MED ORDER — METHOCARBAMOL 750 MG PO TAB
750 mg | Freq: Three times a day (TID) | ORAL | 0 refills | Status: AC
Start: 2022-01-29 — End: ?
  Administered 2022-01-30 – 2022-02-01 (×8): 750 mg via ORAL

## 2022-01-29 MED ORDER — PHENYLEPHRINE HCL IN 0.9% NACL 1 MG/10 ML (100 MCG/ML) IV SYRG
INTRAVENOUS | 0 refills | Status: DC
Start: 2022-01-29 — End: 2022-01-29

## 2022-01-29 MED ORDER — ONDANSETRON HCL (PF) 4 MG/2 ML IJ SOLN
INTRAVENOUS | 0 refills | Status: DC
Start: 2022-01-29 — End: 2022-01-29

## 2022-01-29 MED ORDER — MELATONIN 5 MG PO TAB
5 mg | Freq: Every evening | ORAL | 0 refills | Status: AC | PRN
Start: 2022-01-29 — End: ?

## 2022-01-29 MED ORDER — METHOCARBAMOL 750 MG PO TAB
750 mg | Freq: Two times a day (BID) | ORAL | 0 refills | Status: DC
Start: 2022-01-29 — End: 2022-01-29
  Administered 2022-01-29: 18:00:00 750 mg via ORAL

## 2022-01-29 MED ORDER — PROPOFOL INJ 10 MG/ML IV VIAL
INTRAVENOUS | 0 refills | Status: DC
Start: 2022-01-29 — End: 2022-01-29

## 2022-01-29 MED ORDER — LACTATED RINGERS IV SOLP
INTRAVENOUS | 0 refills | Status: DC
Start: 2022-01-29 — End: 2022-01-29

## 2022-01-29 MED ORDER — OXYCODONE 5 MG PO TAB
5-10 mg | ORAL | 0 refills | Status: AC | PRN
Start: 2022-01-29 — End: ?
  Administered 2022-01-29 – 2022-02-01 (×15): 10 mg via ORAL

## 2022-01-29 MED ORDER — OXYCODONE 5 MG PO TAB
5 mg | Freq: Once | ORAL | 0 refills | Status: DC | PRN
Start: 2022-01-29 — End: 2022-01-29

## 2022-01-29 MED ORDER — FENTANYL CITRATE (PF) 50 MCG/ML IJ SOLN
25-50 ug | INTRAVENOUS | 0 refills | Status: DC | PRN
Start: 2022-01-29 — End: 2022-01-29

## 2022-01-29 MED ORDER — ONDANSETRON HCL (PF) 4 MG/2 ML IJ SOLN
4 mg | INTRAVENOUS | 0 refills | Status: AC | PRN
Start: 2022-01-29 — End: ?

## 2022-01-29 MED ORDER — HYDROMORPHONE (PF) 2 MG/ML IJ SYRG
INTRAVENOUS | 0 refills | Status: DC
Start: 2022-01-29 — End: 2022-01-29

## 2022-01-29 MED ORDER — HYDROMORPHONE (PF) 2 MG/ML IJ SYRG
.5 mg | INTRAVENOUS | 0 refills | Status: DC | PRN
Start: 2022-01-29 — End: 2022-01-29

## 2022-01-29 MED ORDER — FENTANYL CITRATE (PF) 50 MCG/ML IJ SOLN
25 ug | INTRAVENOUS | 0 refills | Status: DC | PRN
Start: 2022-01-29 — End: 2022-01-29
  Administered 2022-01-29 (×4): 25 ug via INTRAVENOUS

## 2022-01-29 MED ORDER — KETAMINE 10 MG/ML IJ SOLN
INTRAVENOUS | 0 refills | Status: DC
Start: 2022-01-29 — End: 2022-01-29

## 2022-01-29 MED ORDER — CEFAZOLIN INJ 1GM IVP
2 g | INTRAVENOUS | 0 refills | Status: AC
Start: 2022-01-29 — End: ?
  Administered 2022-01-29 – 2022-01-30 (×2): 2 g via INTRAVENOUS

## 2022-01-29 MED ORDER — ENOXAPARIN 30 MG/0.3 ML SC SYRG
30 mg | Freq: Two times a day (BID) | SUBCUTANEOUS | 0 refills | Status: AC
Start: 2022-01-29 — End: ?
  Administered 2022-01-30 – 2022-02-01 (×3): 30 mg via SUBCUTANEOUS

## 2022-01-29 MED ORDER — FENTANYL CITRATE (PF) 50 MCG/ML IJ SOLN
25 ug | INTRAVENOUS | 0 refills | Status: DC | PRN
Start: 2022-01-29 — End: 2022-01-29

## 2022-01-29 MED ORDER — PROPOFOL 10 MG/ML IV EMUL 20 ML (INFUSION)(AM)(OR)
INTRAVENOUS | 0 refills | Status: DC
Start: 2022-01-29 — End: 2022-01-29
  Administered 2022-01-29: 12:00:00 100 ug/kg/min via INTRAVENOUS

## 2022-01-29 MED ORDER — IOHEXOL 350 MG IODINE/ML IV SOLN
100 mL | Freq: Once | INTRAVENOUS | 0 refills | Status: CP
Start: 2022-01-29 — End: ?
  Administered 2022-01-29: 18:00:00 100 mL via INTRAVENOUS

## 2022-01-29 MED ORDER — DEXAMETHASONE SODIUM PHOSPHATE 4 MG/ML IJ SOLN
INTRAVENOUS | 0 refills | Status: DC
Start: 2022-01-29 — End: 2022-01-29

## 2022-01-29 MED ORDER — LIDOCAINE (PF) 200 MG/10 ML (2 %) IJ SYRG
INTRAVENOUS | 0 refills | Status: DC
Start: 2022-01-29 — End: 2022-01-29

## 2022-01-29 MED ORDER — ROCURONIUM 10 MG/ML IV SOLN
INTRAVENOUS | 0 refills | Status: DC
Start: 2022-01-29 — End: 2022-01-29

## 2022-01-29 MED ORDER — ONDANSETRON 4 MG PO TBDI
4 mg | ORAL | 0 refills | Status: AC | PRN
Start: 2022-01-29 — End: ?

## 2022-01-29 MED ORDER — ENOXAPARIN 40 MG/0.4 ML SC SYRG
40 mg | Freq: Every day | SUBCUTANEOUS | 0 refills | Status: DC
Start: 2022-01-29 — End: 2022-01-29

## 2022-01-29 MED ORDER — VITAMIN B COMPLEX PO TAB
1 | Freq: Every day | ORAL | 0 refills | Status: AC
Start: 2022-01-29 — End: ?
  Administered 2022-01-29 – 2022-02-01 (×4): 1 via ORAL

## 2022-01-29 MED ORDER — CEFAZOLIN 10 G/150 ML INFUSION (AN) (OSM)
INTRAVENOUS | 0 refills | Status: DC
Start: 2022-01-29 — End: 2022-01-29
  Administered 2022-01-29: 13:00:00 2 g via INTRAVENOUS

## 2022-01-29 MED ORDER — THIAMINE MONONITRATE (VIT B1) 100 MG PO TAB
100 mg | Freq: Every day | ORAL | 0 refills | Status: AC
Start: 2022-01-29 — End: ?
  Administered 2022-01-29 – 2022-02-01 (×4): 100 mg via ORAL

## 2022-01-29 MED ORDER — FOLIC ACID 1 MG PO TAB
1 mg | Freq: Every day | ORAL | 0 refills | Status: AC
Start: 2022-01-29 — End: ?
  Administered 2022-01-29 – 2022-02-01 (×4): 1 mg via ORAL

## 2022-01-29 MED ORDER — METOCLOPRAMIDE HCL 5 MG/ML IJ SOLN
10 mg | Freq: Once | INTRAVENOUS | 0 refills | Status: DC | PRN
Start: 2022-01-29 — End: 2022-01-29

## 2022-01-29 MED ORDER — OXYCODONE 5 MG PO TAB
5 mg | Freq: Once | ORAL | 0 refills | Status: CP | PRN
Start: 2022-01-29 — End: ?
  Administered 2022-01-29: 15:00:00 5 mg via ORAL

## 2022-01-29 MED ORDER — POLYETHYLENE GLYCOL 3350 17 GRAM PO PWPK
1 | Freq: Every day | ORAL | 0 refills | Status: DC | PRN
Start: 2022-01-29 — End: 2022-01-29

## 2022-01-29 NOTE — Anesthesia Post-Procedure Evaluation
Post-Anesthesia Evaluation    Name: Paul [name]      MRN: [id number]     DOB: [birth year]     Age: 36 y.o.     Sex: male   __________________________________________________________________________     Procedure Information       Anesthesia Start Date/Time: 01/29/22 0639    Procedure: OPEN REDUCTION LEFT POSTERIOR HIP DISLOCATION (Left: Hip)    Location: MAIN OR 37 / Main OR/Periop    Surgeons: Charlyne Quale, MD            Post-Anesthesia Vitals  BP: 141/100 (12/24 1030)  Pulse: 112 (12/24 1030)  Respirations: 15 PER MINUTE (12/24 1030)  SpO2: 96 % (12/24 1030)  O2 Device: Nasal cannula (12/24 1015)   Vitals Value Taken Time   BP 141/100 01/29/22 1030   Temp 36.9 C (98.4 F) 01/29/22 0843   Pulse 112 01/29/22 1030   Respirations 15 PER MINUTE 01/29/22 1030   SpO2 96 % 01/29/22 1030   O2 Device Nasal cannula 01/29/22 1015   ABP     ART BP           Post Anesthesia Evaluation Note    Evaluation location: Pre/Post  Patient participation: recovered; patient participated in evaluation  Level of consciousness: alert    Pain score: 0  Pain management: adequate    Hydration: normovolemia  Temperature: 36.0C - 38.4C  Airway patency: adequate    Perioperative Events       Post-op nausea and vomiting: no PONV    Postoperative Status  Cardiovascular status: hemodynamically stable  Respiratory status: spontaneous ventilation  Follow-up needed: none        Perioperative Events  There were no known notable events for this encounter.
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
## 2022-01-29 NOTE — Anesthesia Procedure Notes
Procedure: Airway Placement    AIRWAY INSERTION    Date/Time: 01/29/2022 6:55 AM    Patient location: OR  Urgency: elective  Difficult Airway: No            Airway Procedure  Indication(s) for airway management: surgery        no    Preoxygenated: yes  Patient position: sniffing  Neck stabilization: no in-line stabilization    Mask difficulty assessment: 1 - vent by mask      Procedure Outcome  Final airway type: endotracheal airway  Endotracheal airway: ETT          ETT size (mm): 7.0  Technique used for successful ETT placement: direct laryngoscopy  Devices/methods used in placement: intubating stylet  Insertion site: oral  Blade type: Macintosh   Laryngoscope/Videolaryngoscope blade size: 4  Cormack-Lehane classification: grade IIa - partial view of glottis      Measured from: gums  Number of attempts at approach: 1  Placement verified by auscultation and bronchoscopy            Complications  Cardiovascular:   Pulmonary:   Procedure: airway not difficult  Medication:         Performed by: Dionne Bucy, MD  Authorized by: Fernande Bras, MD
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
## 2022-01-29 NOTE — Other
Brief Operative Note    Name: Paul [name] is a 36 y.o. male     DOB: 02-19-1985             MRN#: [id number]  DATE OF OPERATION: 01/29/2022    Date:  01/29/2022        Preoperative Dx:   Closed dislocation of left hip, initial encounter (HCC) [S73.005A]    Post-op Diagnosis      * Closed dislocation of left hip, initial encounter (HCC) [S73.005A]    Procedure(s) (LRB):  OPEN REDUCTION LEFT POSTERIOR HIP DISLOCATION (Left)    Surgeon(s) and Role:     * Charlyne Quale, MD - Primary     * Denton Ar, MD - Resident - Assisting    Surgical site infection present at time of surgery?  No        Findings:  Dislocated Left hip    Estimated Blood Loss: 300 ml    Specimen(s) Removed/Disposition: * No specimens in log *    Complications:  None      Implants: * No implants in log *    Drains: Details on drains available on LDA report    Disposition:  PACU - stable    Denton Ar, MD  Pager [phone]
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
## 2022-01-29 NOTE — Progress Notes
Ortho Update Note:     Post-Op Recommendations        - PT/OT  - WB status: WBAT LLE in knee immobilizer, posterior hip precautions  - Abx:  24 hrs post op - ordered  - Rad onc c/s -> radiation therapy for HO ppx  - Pain control per primary team  - Ortho to maintain dressings  - Call ortho with questions   - DVT Ppx: Lovenox in house, DC on 4 weeks 81 ASA BID    Denton Ar, MD  [phone]

## 2022-01-29 NOTE — Anesthesia Post-Procedure Evaluation
Post-Anesthesia Evaluation    Name: Paul [name]      MRN: [id number]     DOB: [birth year]     Age: 36 y.o.     Sex: male   __________________________________________________________________________     Procedure Information       Anesthesia Start Date/Time: 01/29/22 0535    Procedure: CLOSED TREATMENT HIP DISLOCATION - TRAUMATIC - WITH ANESTHESIA (Left)    Location: MAIN OR 37 / Main OR/Periop    Surgeons: Charlyne Quale, MD            Post-Anesthesia Vitals  BP: 140/100 (12/24 0630)  Pulse: 97 (12/24 0630)  Respirations: 14 PER MINUTE (12/24 0630)  SpO2: 96 % (12/24 0630)  O2 Device: None (Room air) (12/24 0630)   Vitals Value Taken Time   BP 140/100 01/29/22 0630   Temp 36.6 C (97.8 F) 01/29/22 0605   Pulse 97 01/29/22 0630   Respirations 14 PER MINUTE 01/29/22 0630   SpO2 96 % 01/29/22 0630   O2 Device None (Room air) 01/29/22 0630   ABP     ART BP           Post Anesthesia Evaluation Note    Evaluation location: Pre/Post  Patient participation: recovered; patient unable to participate at baseline  Level of consciousness: sleepy but conscious and alert    Pain score: 5  Pain management: adequate    Hydration: normovolemia  Temperature: 36.0C - 38.4C  Airway patency: adequate    Perioperative Events       Post-op nausea and vomiting: no PONV    Postoperative Status  Cardiovascular status: hemodynamically stable  Respiratory status: spontaneous ventilation  Follow-up needed: none  Additional comments: Estat called and pt was brought to the OR again for open treatment        Perioperative Events  There were no known notable events for this encounter.
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
## 2022-01-29 NOTE — Other
Brief Operative Note    Name: Oluwatobiloba Martin is a 36 y.o. male     DOB: Jun 18, 1985             MRN#: [id number]  DATE OF OPERATION: 01/29/2022    Date:  01/29/2022        Preoperative Dx:   Closed dislocation of left hip, initial encounter (HCC) [S73.005A]    Post-op Diagnosis      * Closed dislocation of left hip, initial encounter (HCC) [S73.005A]    Procedure(s) (LRB):  CLOSED TREATMENT HIP DISLOCATION - TRAUMATIC - WITH ANESTHESIA (Left)    Surgeon(s) and Role:     * Charlyne Quale, MD - Primary     * Denton Ar, MD    Surgical site infection present at time of surgery?  No    Findings:  Persistent subluxation    Estimated Blood Loss: No blood loss documented.     Specimen(s) Removed/Disposition: * No specimens in log *    Complications:  None      Implants: * No implants in log *    Drains: Details on drains available on LDA report    Disposition:  PACU - stable    Brennan Bailey, MD  Pager

## 2022-01-30 ENCOUNTER — Encounter: Admit: 2022-01-30 | Discharge: 2022-01-30 | Payer: MEDICARE
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
## 2022-01-31 ENCOUNTER — Encounter: Admit: 2022-01-31 | Discharge: 2022-01-31 | Payer: MEDICARE

## 2022-01-31 ENCOUNTER — Inpatient Hospital Stay: Admit: 2022-01-31 | Discharge: 2022-01-31 | Payer: MEDICARE

## 2022-01-31 DIAGNOSIS — K929 Disease of digestive system, unspecified: Secondary | ICD-10-CM

## 2022-01-31 DIAGNOSIS — I10 Essential (primary) hypertension: Secondary | ICD-10-CM

## 2022-01-31 DIAGNOSIS — S72002D Fracture of unspecified part of neck of left femur, subsequent encounter for closed fracture with routine healing: Secondary | ICD-10-CM

## 2022-01-31 MED ORDER — ROCURONIUM 10 MG/ML IV SOLN
INTRAVENOUS | 0 refills | Status: DC
Start: 2022-01-31 — End: 2022-01-31

## 2022-01-31 MED ORDER — ELECTROLYTE-A IV SOLP
INTRAVENOUS | 0 refills | Status: DC
Start: 2022-01-31 — End: 2022-01-31

## 2022-01-31 MED ORDER — LIDOCAINE (PF) 200 MG/10 ML (2 %) IJ SYRG
INTRAVENOUS | 0 refills | Status: DC
Start: 2022-01-31 — End: 2022-01-31

## 2022-01-31 MED ORDER — ONDANSETRON HCL (PF) 4 MG/2 ML IJ SOLN
INTRAVENOUS | 0 refills | Status: DC
Start: 2022-01-31 — End: 2022-01-31

## 2022-01-31 MED ORDER — SUGAMMADEX 100 MG/ML IV SOLN
INTRAVENOUS | 0 refills | Status: DC
Start: 2022-01-31 — End: 2022-01-31

## 2022-01-31 MED ORDER — TRANEXAMIC ACID IN NACL,ISO-OS 1,000 MG/100 ML (10 MG/ML) IV PGBK
INTRAVENOUS | 0 refills | Status: DC
Start: 2022-01-31 — End: 2022-01-31

## 2022-01-31 MED ORDER — MIDAZOLAM 1 MG/ML IJ SOLN
INTRAVENOUS | 0 refills | Status: DC
Start: 2022-01-31 — End: 2022-01-31

## 2022-01-31 MED ORDER — HYDROMORPHONE (PF) 2 MG/ML IJ SYRG
INTRAVENOUS | 0 refills | Status: DC
Start: 2022-01-31 — End: 2022-01-31

## 2022-01-31 MED ORDER — ARTIFICIAL TEARS (PF) SINGLE DOSE DROPS GROUP
OPHTHALMIC | 0 refills | Status: DC
Start: 2022-01-31 — End: 2022-01-31

## 2022-01-31 MED ORDER — CEFAZOLIN 1 GRAM IJ SOLR
INTRAVENOUS | 0 refills | Status: DC
Start: 2022-01-31 — End: 2022-01-31

## 2022-01-31 MED ORDER — FENTANYL CITRATE (PF) 50 MCG/ML IJ SOLN
INTRAVENOUS | 0 refills | Status: DC
Start: 2022-01-31 — End: 2022-01-31

## 2022-01-31 MED ORDER — DEXAMETHASONE SODIUM PHOSPHATE 4 MG/ML IJ SOLN
INTRAVENOUS | 0 refills | Status: DC
Start: 2022-01-31 — End: 2022-01-31

## 2022-01-31 MED ORDER — KETAMINE 10 MG/ML IJ SOLN
INTRAVENOUS | 0 refills | Status: DC
Start: 2022-01-31 — End: 2022-01-31

## 2022-01-31 MED ORDER — DEXMEDETOMIDINE IN 0.9 % NACL 20 MCG/5 ML (4 MCG/ML) IV SYRG
INTRAVENOUS | 0 refills | Status: DC
Start: 2022-01-31 — End: 2022-01-31

## 2022-01-31 MED ORDER — PROPOFOL INJ 10 MG/ML IV VIAL
INTRAVENOUS | 0 refills | Status: DC
Start: 2022-01-31 — End: 2022-01-31

## 2022-01-31 MED ADMIN — HYDROMORPHONE (PF) 2 MG/ML IJ SYRG [163476]: 0.5 mg | INTRAVENOUS | Stop: 2022-02-01 | NDC 00409131203

## 2022-01-31 MED ADMIN — FENTANYL CITRATE (PF) 50 MCG/ML IJ SOLN [3037]: 25 ug | INTRAVENOUS | Stop: 2022-02-01 | NDC 00641602701

## 2022-01-31 MED ADMIN — LACTATED RINGERS IV SOLP [4318]: 1000 mL | INTRAVENOUS | @ 20:00:00 | Stop: 2022-01-31 | NDC 00338011704

## 2022-01-31 NOTE — Anesthesia Pre-Procedure Evaluation
Anesthesia Pre-Procedure Evaluation    Name: Paul [name]      MRN: [id number]     DOB: 03/11/1985     Age: 36 y.o.     Sex: male   _________________________________________________________________________     Procedure Info:   Procedure Information       Date/Time: 01/31/22 1257    Procedure: OPEN TREATMENT TRAUMATIC HIP DISLOCATION WITH ACETABULAR WALL AND FEMORAL HEAD FRACTURE WITH/ WITHOUT INTERNAL/ EXTERNAL FIXATION (Left)    Location: MAIN OR 01 / Main OR/Periop    Surgeons: Charlyne Quale, MD            Physical Assessment  Vital Signs (last filed in past 24 hours):  BP: 123/83 (12/26 1247)  Temp: 36.7 ?C (98 ?F) (12/26 1247)  Pulse: 95 (12/26 1247)  Respirations: 16 PER MINUTE (12/26 1247)  SpO2: 97 % (12/26 1247)  O2 Device: None (Room air) (12/26 1247)      Patient History   Allergies   Allergen Reactions    Aloe Vera RASH    Macadamia Nut Oil RASH    Poison Ivy EDEMA    Vitamin E RASH        Current Medications    Medication Directions   acetaminophen (TYLENOL) 500 mg tablet Take two tablets by mouth every 6 hours as needed for Pain. Max of 4,000 mg of acetaminophen in 24 hours.  Patient taking differently: Take two tablets to three tablets by mouth every 12 hours as needed for Pain. Max of 4,000 mg of acetaminophen in 24 hours.   ibuprofen (ADVIL) 200 mg tablet Take four tablets by mouth every 8 hours as needed for Pain. Take with food.   lisinopril (ZESTRIL) 5 mg tablet Take one tablet by mouth daily.         Review of Systems/Medical History      Patient summary reviewed  Pertinent labs reviewed    PONV Screening: Postoperative opioids    No family history of anesthetic complications      Airway - negative        Pulmonary       Current smoker; patient did not smoke on day of surgery        no COPD       No pulmonary embolus      Cardiovascular         Exercise tolerance: >4 METS      Beta Blocker therapy: No      Beta blockers within 24 hours: n/a      Hypertension          No past MI      No hx of coronary artery disease            Dysrhythmias    No angina      No DVT      No indications/hx of CHF        GI/Hepatic/Renal             GERD (no meds)        Liver disease (NASH):         No renal disease:           Neuro/Psych       No seizures        No CVA      Substance use, alcohol and methamphetamines        Psychiatric history      Musculoskeletal         Fractures  L femur IMN/ORIF (07/2018)        Endocrine/Other           No hypothyroidism      Anemia        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    Abdominal Findings:       Obese      Abdomen soft      Bowel sounds normal.    Neurological Findings:       Alert and oriented x 3    Normal mental status      Anxious    Constitutional findings: Negative      No acute distress      Well-developed       Diagnostic Tests  Hematology:   Lab Results   Component Value Date    HGB 14.1 01/29/2022    HCT 42.6 01/29/2022    PLTCT 247 01/29/2022    WBC 11.4 01/29/2022    MCV 88.9 01/29/2022    MCH 29.3 01/29/2022    MCHC 33.0 01/29/2022    MPV 7.9 01/29/2022    RDW 13.9 01/29/2022           General Chemistry:   Lab Results   Component Value Date    NA 142 01/29/2022    K 3.5 01/29/2022    CL 109 01/29/2022    CO2 22 01/29/2022    GAP 11 01/29/2022    BUN 9 01/29/2022    CR 0.68 01/29/2022    GLU 90 01/29/2022    CA 7.8 01/29/2022    ALBUMIN 3.9 01/29/2022    MG 1.8 01/29/2022    TOTBILI 0.3 01/29/2022    PO4 3.1 01/29/2022        Coagulation:   Lab Results   Component Value Date    PT 11.3 01/29/2022    PTT 25.9 01/29/2022    INR 1.0 01/29/2022         Anesthesia Plan    ASA score: 3   Plan: general  Induction method: intravenous  NPO status: acceptable      Informed Consent  Anesthetic plan and risks discussed with patient.  Use of blood products discussed with patient  Blood Consent: consented      Plan discussed with: CRNA.    PAC Plan  Alerts: No

## 2022-01-31 NOTE — Anesthesia Procedure Notes
Procedure: Airway Placement    AIRWAY INSERTION    Date/Time: 01/31/2022 2:45 PM    Patient location: OR  Urgency: elective  Difficult Airway: No            Airway Procedure  Indication(s) for airway management: surgery        no    Preoxygenated: yes  Patient position: sniffing  Neck stabilization: no in-line stabilization    Mask difficulty assessment: 1 - vent by mask      Procedure Outcome  Final airway type: endotracheal airway  Endotracheal airway: ETT          ETT size (mm): 7.5  Technique used for successful ETT placement: direct laryngoscopy  Devices/methods used in placement: intubating stylet  Insertion site: oral  Blade type: Macintosh   Laryngoscope/Videolaryngoscope blade size: 3  Cormack-Lehane classification: grade I - full view of glottis      Measured from: teeth (23)  Number of attempts at approach: 1  Placement verified by auscultation and capnometry            Complications  Cardiovascular:   Pulmonary:   Procedure: airway not difficult  Medication:   Additional notes: Atraumatic, dentition unchanged      Performed by: Meyer Cory, CRNA  Authorized by: Adair Laundry, MD

## 2022-02-01 ENCOUNTER — Encounter: Admit: 2022-02-01 | Discharge: 2022-02-01 | Payer: MEDICARE

## 2022-02-01 ENCOUNTER — Ambulatory Visit: Admit: 2022-02-01 | Discharge: 2022-02-01 | Payer: MEDICARE

## 2022-02-01 DIAGNOSIS — S72002D Fracture of unspecified part of neck of left femur, subsequent encounter for closed fracture with routine healing: Secondary | ICD-10-CM

## 2022-02-01 MED ADMIN — CEFAZOLIN INJ 1GM IVP [210319]: 2 g | INTRAVENOUS | @ 12:00:00 | Stop: 2022-02-01 | NDC 60505614200

## 2022-02-01 MED ADMIN — CEFAZOLIN INJ 1GM IVP [210319]: 2 g | INTRAVENOUS | @ 05:00:00 | Stop: 2022-02-01 | NDC 60505614200

## 2022-02-01 MED ADMIN — MELATONIN 5 MG PO TAB [168576]: 5 mg | ORAL | @ 02:00:00 | NDC 77333052025

## 2022-02-01 MED ADMIN — HYDROMORPHONE (PF) 2 MG/ML IJ SYRG [163476]: 0.5 mg | INTRAVENOUS | Stop: 2022-02-01 | NDC 00409131203

## 2022-02-01 NOTE — Progress Notes
Judithann Sauger.received radiation therapy treatment today.  1 of 1 treatments.

## 2022-02-01 NOTE — Progress Notes
Paul [name].received radiation therapy simulation for treatment planning today.

## 2022-02-01 NOTE — Anesthesia Post-Procedure Evaluation
Post-Anesthesia Evaluation    Name: Paul [name]      MRN: [id number]     DOB: [birth year]     Age: 36 y.o.     Sex: male   __________________________________________________________________________     Procedure Information       Anesthesia Start Date/Time: 01/31/22 1432    Procedure: OPEN TREATMENT TRAUMATIC HIP DISLOCATION WITH ACETABULAR WALL AND FEMORAL HEAD FRACTURE WITH/ WITHOUT INTERNAL/ EXTERNAL FIXATION (Left: Hip)    Location: MAIN OR 01 / Main OR/Periop    Surgeons: Charlyne Quale, MD            Post-Anesthesia Vitals  BP: 129/78 (12/26 1845)  Temp: 36.6 C (97.8 F) (12/26 1845)  Pulse: 101 (12/26 1845)  Respirations: 15 PER MINUTE (12/26 1845)  SpO2: 93 % (12/26 1845)  O2 Device: Nasal cannula (12/26 1845)   Vitals Value Taken Time   BP 129/78 01/31/22 1845   Temp 36.6 C (97.8 F) 01/31/22 1845   Pulse 101 01/31/22 1845   Respirations 15 PER MINUTE 01/31/22 1845   SpO2 93 % 01/31/22 1845   O2 Device Nasal cannula 01/31/22 1845   ABP     ART BP           Post Anesthesia Evaluation Note    Evaluation location: Pre/Post  Patient participation: recovered; patient participated in evaluation  Level of consciousness: alert    Pain score: 5  Pain management: adequate    Hydration: normovolemia  Temperature: 36.0C - 38.4C  Airway patency: adequate    Perioperative Events       Post-op nausea and vomiting: no PONV    Postoperative Status  Cardiovascular status: hemodynamically stable  Respiratory status: spontaneous ventilation  Follow-up needed: none        Perioperative Events  There were no known notable events for this encounter.

## 2022-02-02 ENCOUNTER — Encounter: Admit: 2022-02-02 | Discharge: 2022-02-02 | Payer: MEDICARE

## 2022-02-02 DIAGNOSIS — K929 Disease of digestive system, unspecified: Secondary | ICD-10-CM

## 2022-02-02 DIAGNOSIS — I10 Essential (primary) hypertension: Secondary | ICD-10-CM

## 2022-02-02 MED FILL — ONDANSETRON 4 MG PO TBDI: 4 mg | ORAL | 2 days supply | Qty: 5 | Fill #1 | Status: CP

## 2022-02-02 MED FILL — METHOCARBAMOL 750 MG PO TAB: 750 mg | ORAL | 7 days supply | Qty: 21 | Fill #1 | Status: CP

## 2022-02-02 MED FILL — OXYCODONE 5 MG PO TAB: 5 mg | ORAL | 3 days supply | Qty: 30 | Fill #1 | Status: CP

## 2022-02-04 ENCOUNTER — Encounter: Admit: 2022-02-04 | Discharge: 2022-02-04 | Payer: MEDICARE

## 2022-02-04 DIAGNOSIS — G8911 Acute pain due to trauma: Secondary | ICD-10-CM

## 2022-02-04 MED ORDER — OXYCODONE 5 MG PO TAB
5-10 mg | ORAL_TABLET | ORAL | 0 refills | 6.00000 days | Status: AC | PRN
Start: 2022-02-04 — End: ?

## 2022-02-04 MED ORDER — FAMOTIDINE 20 MG PO TAB
20 mg | ORAL_TABLET | Freq: Two times a day (BID) | ORAL | 0 refills | 90.00000 days | Status: AC
Start: 2022-02-04 — End: ?

## 2022-02-04 MED ORDER — IBUPROFEN 600 MG PO TAB
ORAL_TABLET | ORAL | 0 refills | Status: AC
Start: 2022-02-04 — End: ?

## 2022-02-04 NOTE — Telephone Encounter
Pt called reports he is almost out of oxycodone. Reports he has been taking tylenol and robaxin, however notes he is having an increased in pain with mobilization and taking two oxycodone. Discussed continuing to take tylenol and robaxin will also have him start taking ibuprofen. Will refill the oxycodone one time. Pt aware and verbalized understanding.

## 2022-02-07 ENCOUNTER — Encounter: Admit: 2022-02-07 | Discharge: 2022-02-07 | Payer: MEDICARE

## 2022-02-07 DIAGNOSIS — K929 Disease of digestive system, unspecified: Secondary | ICD-10-CM

## 2022-02-07 DIAGNOSIS — I10 Essential (primary) hypertension: Secondary | ICD-10-CM

## 2022-02-14 ENCOUNTER — Encounter: Admit: 2022-02-14 | Discharge: 2022-02-14 | Payer: MEDICARE

## 2022-02-14 NOTE — Telephone Encounter
Received VM from pt stating that his immobilizer has been bothering him and wanting to know how long he had to keep it on. Spoke with pt to inform him to keep the immobilizer on until his POV and rescheduled his POV to 01.17.24.

## 2022-03-02 ENCOUNTER — Encounter: Admit: 2022-03-02 | Discharge: 2022-03-02 | Payer: MEDICARE

## 2022-03-07 ENCOUNTER — Encounter: Admit: 2022-03-07 | Discharge: 2022-03-07 | Payer: MEDICARE

## 2022-03-07 DIAGNOSIS — Z9889 Other specified postprocedural states: Secondary | ICD-10-CM

## 2022-03-08 ENCOUNTER — Ambulatory Visit: Admit: 2022-03-08 | Discharge: 2022-03-08 | Payer: MEDICARE

## 2022-03-08 ENCOUNTER — Encounter: Admit: 2022-03-08 | Discharge: 2022-03-08 | Payer: MEDICARE

## 2022-03-08 DIAGNOSIS — K929 Disease of digestive system, unspecified: Secondary | ICD-10-CM

## 2022-03-08 DIAGNOSIS — Z9889 Other specified postprocedural states: Secondary | ICD-10-CM

## 2022-03-08 DIAGNOSIS — I10 Essential (primary) hypertension: Secondary | ICD-10-CM

## 2022-03-08 NOTE — Patient Instructions
Please do not hesitate to contact my office with any questions.    Dr. Brent Wise  - Orthopedic Surgeon, Trauma  [facility] - Phone [phone] - Fax [phone]   [address] - [place], [place] [address]      Liz Crane, RN, BSN - Paige Torset RN  Ambulatory Clinic RN Care Coordinator  - [facility]  Phone [phone] - Fax [phone]   [address] - [place], [place] [address]

## 2022-03-14 ENCOUNTER — Encounter: Admit: 2022-03-14 | Discharge: 2022-03-14 | Payer: MEDICARE

## 2022-03-14 NOTE — Telephone Encounter
Received VM from pt requesting that his PT referral be sent to [facility] at fax number [phone]. PT referral was faxed on 02.06.24.

## 2022-04-27 ENCOUNTER — Encounter: Admit: 2022-04-27 | Discharge: 2022-04-27 | Payer: MEDICARE

## 2022-07-17 ENCOUNTER — Encounter: Admit: 2022-07-17 | Discharge: 2022-07-17 | Payer: MEDICARE

## 2022-10-26 ENCOUNTER — Encounter: Admit: 2022-10-26 | Discharge: 2022-10-26 | Payer: MEDICARE

## 2023-11-14 ENCOUNTER — Encounter: Admit: 2023-11-14 | Discharge: 2023-11-14 | Payer: MEDICARE

## 2023-12-03 ENCOUNTER — Encounter: Admit: 2023-12-03 | Discharge: 2023-12-03 | Payer: MEDICARE

## 2023-12-03 DIAGNOSIS — S72352D Displaced comminuted fracture of shaft of left femur, subsequent encounter for closed fracture with routine healing: Secondary | ICD-10-CM

## 2023-12-03 DIAGNOSIS — M25562 Pain in left knee: Secondary | ICD-10-CM

## 2023-12-03 DIAGNOSIS — Z9889 Other specified postprocedural states: Principal | ICD-10-CM

## 2024-01-09 ENCOUNTER — Encounter: Admit: 2024-01-09 | Discharge: 2024-01-09 | Payer: MEDICARE

## 2024-01-09 DIAGNOSIS — M25562 Pain in left knee: Principal | ICD-10-CM

## 2024-01-09 DIAGNOSIS — Z9889 Other specified postprocedural states: Secondary | ICD-10-CM

## 2024-01-10 ENCOUNTER — Encounter: Admit: 2024-01-10 | Discharge: 2024-01-10 | Payer: MEDICARE

## 2024-01-10 ENCOUNTER — Ambulatory Visit: Admit: 2024-01-10 | Discharge: 2024-01-10 | Payer: MEDICARE

## 2024-01-10 ENCOUNTER — Ambulatory Visit: Admit: 2024-01-10 | Discharge: 2024-01-11 | Payer: MEDICARE

## 2024-01-10 DIAGNOSIS — S7292XK Unspecified fracture of left femur, subsequent encounter for closed fracture with nonunion: Secondary | ICD-10-CM

## 2024-01-10 NOTE — Pre-Anesthesia Patient Instructions [112001]
 PREPROCEDURE INFORMATION    Arrival at the hospital  [facility]  [address]  [place], [place] [address]    Park in the [employer], located directly across from the main entrance to the hospital.  Enter through the ground floor main hospital entrance and check in at the Information Desk in the lobby.  They will validate your parking ticket and direct you to the next location.  If you are a woman between the ages of 49 and 81, and have not had a hysterectomy, you will be asked for a urine sample prior to surgery.  Please do not urinate before arriving in the Surgery Waiting Room.  Once there, check in and let the attendant know if you need to provide a sample.  You will receive a call with your surgery arrival time between 2:30pm and 4:30pm the last business day before your procedure.     *IMPORTANT* Eating and drinking instructions before surgery  Nothing to eat after 11:00pm the night before your surgery - including gum, mints, and hard candy. You may have clear liquids up to 2 hours before your arrival time.     Clear liquid examples include: (If diabetic, blood sugar must be less than 200)  Water   Clear juice (apple or cranberry - no pulp or orange juice)  Black coffee and plain tea (sugar/artificial sweetener only - add nothing else)  Sports drink (Powerade/Gatorade)  Carbonated Beverages (Sprite, Cola)  Pedialyte  Bowel Prep (if ordered by physician)    Planning transportation for outpatient procedure  For your safety, you will need to arrange for a responsible ride/person to accompany you home due to sedation or anesthesia with your procedure.  An [place], taxi or other public transportation driver is not considered a responsible person to accompany you home.    Bath/Shower Instructions  Take a bath or shower with antibacterial soap the night before and the morning of your procedure. Use clean towels.  Put on clean clothes after bath or shower.  Avoid using lotion and oils.  Sleep on clean sheets if bath or shower is done the night before procedure.  Follow the instructions you received from your surgeon.    Morning of your procedure:  Brush your teeth and tongue  Do not shave the area where you will have surgery.  Remove nail polish, makeup and all jewelry (including piercings) before coming to the hospital.  Dress in clean, loose, comfortable clothing.    Substance use guidance:  We recommend stopping smoking, vaping, and excessive alcohol use between now and surgery.   We recommend avoiding cannabis products 3 days before surgery.  The morning of your procedure, do not smoke, vape, or chew tobacco, do not drink any alcohol.    Valuables  Leave money, credit cards, jewelry, and any other valuables at home. If medications are being filled and picked up at a [place] pharmacy, payment will be needed. [facility] is not responsible for the loss or breakage of personal items.    What to bring to the hospital  ID/Insurance card  Medical Device card  Official documents for legal guardianship  Copy of your Living Will, Advanced Directives, and/or Durable Power of Attorney. If you have these documents, please bring them to the admissions office on the day of your surgery to be scanned into your records.  Do not bring medications from home unless instructed by a pharmacist.  Cases for glasses, hearing aids, contact lenses (bring solution for  contacts)    Notify your surgeon if:  There is a possibility that you are pregnant.  You become ill with a cough, fever, sore throat, nausea, vomiting or flu-like symptoms.  You have any open wounds/sores that are red, painful, draining, or are new since you last saw the doctor.  You need to cancel your procedure.    Preparing to get your medications at discharge  Your surgeon may prescribe you medications to take after your procedure.  If you like the convenience of having your medications filled here at [facility], please do the following:  Go to [facility] [place] pharmacy after your [facility] appointment to put a credit card on file.  Bring a credit card or cash on the day of your procedure- please leave with a family member rather than bringing it into the preop area.    Current Visitor Policy:  Visitors must be free of fever and symptoms to be in our facilities.  No more than 2 visitors per patient are allowed.  Additional guidelines may vary, based on patient care area or patient's condition.  Patients in semiprivate rooms may have visitors, but visits should be coordinated so only two total visitors are in a room at a time due to space limitations.  Children younger than age 76 are allowed to visit inpatients.  Please dress in layers as some areas may be colder or warmer than others.    Important phone numbers  To put a credit card on file call [facility]:  [phone]  For any medication changes or questions call pharmacy at [phone]  For arrival time questions (after 4:30pm the business day prior to surgery) call [phone]  For day of surgery cancellation or to notify us  for a late arrival call [phone]    If you have any changes to your health or hospitalizations between now and your surgery, or pre-op questions please call us  at [phone].    Instructions given to patient via: MyChart and verbal    Thank you for participating in your Preoperative Assessment visit today.

## 2024-01-10 NOTE — Pre-Anesthesia Medication Instructions [112002]
 YOUR MEDICATION LIST   acetaminophen  (TYLENOL ) 500 mg tablet Take two tablets by mouth every 6 hours as needed for Pain. Max of 4,000 mg of acetaminophen  in 24 hours. (Patient not taking: Reported on 01/10/2024)    buprenorphine-naloxone  (SUBOXONE) 8/2 mg sublingual film Place one Film under tongue four times daily.    duloxetine DR (CYMBALTA) 20 mg capsule Take two capsules by mouth daily.    famotidine  (PEPCID ) 20 mg tablet Take one tablet by mouth twice daily. (Patient taking differently: Take one tablet by mouth twice daily. Only takes it sometimes)    folic acid  (FOLVITE ) 1 mg tablet Take one tablet by mouth daily. (Patient not taking: Reported on 01/10/2024)    hydrOXYzine  HCL (ATARAX ) 25 mg tablet Take one tablet by mouth nightly as needed.    multivitamin (ONE-A-DAY) tablet Take one tablet by mouth daily. (Patient not taking: Reported on 01/10/2024)    naloxone  (NARCAN ) 4 mg/actuation nasal spray Insert 1 spray into 1 nostril as needed for signs of opioid overdose then call 911. May repeat dose every 2-3 minutes (alternate nostrils) until medical team arrives. (Patient not taking: Reported on 01/10/2024)    ondansetron  (ZOFRAN  ODT) 4 mg rapid dissolve tablet Dissolve one tablet by mouth every 6 hours as needed. Place on tongue to dissolve. (Patient not taking: Reported on 01/10/2024)    oxyCODONE  (ROXICODONE ) 5 mg tablet Take one tablet to two tablets by mouth every 6 hours as needed for Pain. (Patient not taking: Reported on 01/10/2024)    polyethylene glycol 3350  (MIRALAX ) 17 g packet Take one packet by mouth daily. (Patient not taking: Reported on 01/10/2024)    senna (SENOKOT) 8.6 mg tablet Take two tablets by mouth twice daily. (Patient not taking: Reported on 01/10/2024)    thiamine  mononitrate (vit B1) 100 mg tablet Take one tablet by mouth daily. (Patient not taking: Reported on 01/10/2024)    varenicline tartrate (CHANTIX STARTING MONTH BOX) 0.5 mg (11)- 1 mg (42) tablet in dose pack Take 0.5mg  by mouth daily for 3 days, then increase to 0.5mg  by mouth twice daily for 4 days, then increase to 1mg  by mouth twice daily. (Patient not taking: Reported on 01/10/2024)    vitamins, B complex tablet Take one tablet by mouth daily. (Patient not taking: Reported on 01/10/2024)       YOUR MEDICATION INSTRUCTIONS FOR PROCEDURE  See below for any changes on how you take your medications before your procedure.    14 DAYS BEFORE PROCEDURE    Do not start any new vitamins, herbals, or natural supplements before procedure.    7 DAYS BEFORE PROCEDURE    DO NOT TAKE the following anti-inflammatory medications such as ibuprofen  (Advil , Motrin ) and naproxen (Aleve)   You may use acetaminophen  (Tylenol )    DAY AND NIGHT BEFORE PROCEDURE    Unless instructed differently, take your medication as usual the day and night before procedure.    MORNING OF PROCEDURE  DO NOT take these medications:    Ointments/creams/lotions    TAKE these medications with water :    Duloxetine  Buprenorphine/Naloxone    Use all inhalers, nasal sprays, and eye drops as usual  May take if needed:  Famotidine   Tylenol      Before going home from the hospital, please make sure you have written instructions for when to restart medications that were stopped for surgery.    Please contact the clinic pharmacist with any changes to your medication list or medication questions before surgery.  E-mail:  [email or web address] (Please include your name and date of birth)  Phone: [phone]
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
## 2024-01-18 ENCOUNTER — Encounter: Admit: 2024-01-18 | Discharge: 2024-01-18 | Payer: MEDICARE

## 2024-01-18 NOTE — Telephone Encounter [36]
 Pt called and spoke with our scheduling team.  He states his ride through insurance didnt get him this morning and he wants to reschedule but doesnt want to do this again to Dr. Leola.   He also told her that if you guys can get me there then dont worry about it.  He hung up before he could be transferred to RN to reschedule surgery.

## 2024-01-18 NOTE — Telephone Encounter [36]
 Hey MRN# [id number] [place] said his ride through insurance didn't get him this morning and he wants to reschedule but doesn't want to do this again to Dr. Leola. He hung up on me. He said if we can get him to surgery than don't worry about it them.

## 2024-01-23 ENCOUNTER — Encounter: Admit: 2024-01-23 | Discharge: 2024-01-23 | Payer: MEDICARE

## 2024-01-23 NOTE — Telephone Encounter [36]
 Called pt and r/s femur nonunion surgery w/ Dr Leola to 12/29. Instructed pt to check in at the admissions office at 730 am that morning, emailed him to email on file a pre op instruction form w/ 730 am check in time written on it. Pt states he will order his transport now so there is no issue w/ that.

## 2024-02-04 ENCOUNTER — Inpatient Hospital Stay: Admit: 2024-02-04 | Discharge: 2024-02-04 | Payer: MEDICARE

## 2024-02-04 ENCOUNTER — Encounter: Admit: 2024-02-04 | Discharge: 2024-02-04 | Payer: MEDICARE

## 2024-02-04 VITALS — BP 168/108 | HR 106 | Temp 97.80000°F | Ht 64.016 in | Wt 205.7 lb

## 2024-02-04 LAB — CBC AND DIFF
~~LOC~~ BKR ABSOLUTE BASO COUNT: 0 10*3/uL (ref 0.00–0.20)
~~LOC~~ BKR ABSOLUTE EOS COUNT: 0.1 10*3/uL (ref 0.00–0.45)
~~LOC~~ BKR ABSOLUTE LYMPH COUNT: 1.7 10*3/uL (ref 1.00–4.80)
~~LOC~~ BKR ABSOLUTE MONO COUNT: 0.5 10*3/uL (ref 0.00–0.80)
~~LOC~~ BKR ABSOLUTE NEUTROPHIL: 5.8 10*3/uL (ref 1.80–7.00)
~~LOC~~ BKR BASOPHILS %: 0.5 % (ref 0.0–2.0)
~~LOC~~ BKR EOSINOPHILS %: 1.4 % (ref 0.0–5.0)
~~LOC~~ BKR HEMATOCRIT: 44 % (ref 40.0–50.0)
~~LOC~~ BKR HEMOGLOBIN: 15 g/dL (ref 13.5–16.5)
~~LOC~~ BKR LYMPHOCYTES %: 20 % — ABNORMAL LOW (ref 24.0–44.0)
~~LOC~~ BKR MCH: 30 pg (ref 26.0–34.0)
~~LOC~~ BKR MCHC: 33 g/dL (ref 32.0–36.0)
~~LOC~~ BKR MCV: 91 fL (ref 80.0–100.0)
~~LOC~~ BKR MONOCYTES %: 6 % (ref 4.0–12.0)
~~LOC~~ BKR MPV: 8.3 fL (ref 7.0–11.0)
~~LOC~~ BKR NEUTROPHILS %: 71 % (ref 41.0–77.0)
~~LOC~~ BKR PLATELET COUNT: 208 10*3/uL (ref 150–400)
~~LOC~~ BKR RBC COUNT: 4.9 10*6/uL (ref 4.40–5.50)
~~LOC~~ BKR RDW: 13 % (ref 11.0–15.0)
~~LOC~~ BKR WBC COUNT: 8.1 10*3/uL (ref 4.50–11.00)

## 2024-02-04 LAB — SED RATE: ~~LOC~~ BKR ESR: 8 mm/h (ref <=15)

## 2024-02-04 LAB — C REACTIVE PROTEIN (CRP): ~~LOC~~ BKR C-REACTIVE PROTEIN: 0.1 mg/dL (ref <1.00)

## 2024-02-04 MED ORDER — METHOCARBAMOL 100 MG/ML IJ SOLN
1000 mg | Freq: Once | INTRAVENOUS | 0 refills | Status: CP
Start: 2024-02-04 — End: ?

## 2024-02-04 MED ORDER — EUCALYPTUS-MENTHOL MM LOZG
1 | ORAL | 0 refills | Status: DC | PRN
Start: 2024-02-04 — End: 2024-02-05

## 2024-02-04 MED ORDER — DOCUSATE SODIUM 100 MG PO CAP
100 mg | Freq: Two times a day (BID) | ORAL | 0 refills | Status: DC
Start: 2024-02-04 — End: 2024-02-05
  Administered 2024-02-04: 100 mg via ORAL

## 2024-02-04 MED ORDER — ACETAMINOPHEN 650 MG RE SUPP
650 mg | RECTAL | 0 refills | Status: DC | PRN
Start: 2024-02-04 — End: 2024-02-05

## 2024-02-04 MED ORDER — ACETAMINOPHEN 500 MG PO TAB
1000 mg | Freq: Once | ORAL | 0 refills | Status: DC | PRN
Start: 2024-02-04 — End: 2024-02-04

## 2024-02-04 MED ORDER — POLYETHYLENE GLYCOL 3350 17 GRAM PO PWPK
17 g | PACK | Freq: Two times a day (BID) | ORAL | 0 refills | 22.00000 days | Status: AC | PRN
Start: 2024-02-04 — End: ?

## 2024-02-04 MED ORDER — OXYCODONE 5 MG PO TAB
5 mg | ORAL | 0 refills | Status: DC | PRN
Start: 2024-02-04 — End: 2024-02-05
  Administered 2024-02-04: 5 mg via ORAL

## 2024-02-04 MED ORDER — DIPHENHYDRAMINE HCL 50 MG/ML IJ SOLN
25 mg | INTRAVENOUS | 0 refills | Status: DC | PRN
Start: 2024-02-04 — End: 2024-02-05

## 2024-02-04 MED ORDER — DIPHENHYDRAMINE HCL 25 MG PO CAP
25 mg | ORAL | 0 refills | Status: DC | PRN
Start: 2024-02-04 — End: 2024-02-05

## 2024-02-04 MED ORDER — TRAMADOL 50 MG PO TAB
50 mg | ORAL_TABLET | ORAL | 0 refills | 7.00000 days | Status: AC | PRN
Start: 2024-02-04 — End: ?

## 2024-02-04 MED ORDER — ACETAMINOPHEN 500 MG PO TAB
1000 mg | ORAL_TABLET | ORAL | 0 refills | 8.00000 days | Status: AC | PRN
Start: 2024-02-04 — End: ?

## 2024-02-04 MED ORDER — ONDANSETRON 4 MG PO TBDI
4 mg | ORAL_TABLET | ORAL | 0 refills | 8.00000 days | Status: AC | PRN
Start: 2024-02-04 — End: ?

## 2024-02-04 MED ORDER — MELATONIN 5 MG PO TAB
5 mg | Freq: Every evening | ORAL | 0 refills | Status: DC | PRN
Start: 2024-02-04 — End: 2024-02-05

## 2024-02-04 MED ORDER — ACETAMINOPHEN 325 MG PO TAB
650 mg | ORAL | 0 refills | Status: DC | PRN
Start: 2024-02-04 — End: 2024-02-05
  Administered 2024-02-04: 650 mg via ORAL

## 2024-02-04 MED ORDER — MAGNESIUM HYDROXIDE 400 MG/5 ML PO SUSP
30 mL | Freq: Every day | ORAL | 0 refills | Status: DC
Start: 2024-02-04 — End: 2024-02-05
  Administered 2024-02-04: 30 mL via ORAL

## 2024-02-04 MED ORDER — HYDROMORPHONE (PF) 1 MG/ML IJ SOLN
.2 mg | INTRAVENOUS | 0 refills | Status: DC | PRN
Start: 2024-02-04 — End: 2024-02-04
  Administered 2024-02-04 (×3): 0.2 mg via INTRAVENOUS

## 2024-02-04 MED ORDER — METHOCARBAMOL 100 MG/ML IJ SOLN
1000 mg | Freq: Once | INTRAVENOUS | 0 refills | Status: DC
Start: 2024-02-04 — End: 2024-02-04

## 2024-02-04 MED ORDER — FENTANYL CITRATE (PF) 50 MCG/ML IJ SOLN
25 ug | INTRAVENOUS | 0 refills | Status: DC | PRN
Start: 2024-02-04 — End: 2024-02-04
  Administered 2024-02-04 (×4): 25 ug via INTRAVENOUS

## 2024-02-04 MED ORDER — FENTANYL CITRATE (PF) 50 MCG/ML IJ SOLN
50 ug | INTRAVENOUS | 0 refills | Status: DC | PRN
Start: 2024-02-04 — End: 2024-02-04
  Administered 2024-02-04: 22:00:00 50 ug via INTRAVENOUS

## 2024-02-04 MED ORDER — CEFAZOLIN 2 GRAM IV SOLR
2 g | INTRAVENOUS | 0 refills | Status: DC
Start: 2024-02-04 — End: 2024-02-05

## 2024-02-04 MED ORDER — LACTATED RINGERS IV BOLUS
1000 mL | Freq: Once | INTRAVENOUS | 0 refills | Status: CP
Start: 2024-02-04 — End: ?
  Administered 2024-02-04: 15:00:00 1000 mL via INTRAVENOUS

## 2024-02-04 MED ORDER — FENTANYL CITRATE (PF) 50 MCG/ML IJ SOLN
25 ug | INTRAVENOUS | 0 refills | Status: DC | PRN
Start: 2024-02-04 — End: 2024-02-05

## 2024-02-04 MED ORDER — ACETAMINOPHEN 1,000 MG/100 ML (10 MG/ML) IV SOLN
1000 mg | Freq: Once | INTRAVENOUS | 0 refills | Status: DC | PRN
Start: 2024-02-04 — End: 2024-02-04

## 2024-02-04 MED ORDER — HALOPERIDOL LACTATE 5 MG/ML IJ SOLN
1 mg | Freq: Once | INTRAVENOUS | 0 refills | Status: DC | PRN
Start: 2024-02-04 — End: 2024-02-04

## 2024-02-04 MED ORDER — ONDANSETRON HCL (PF) 4 MG/2 ML IJ SOLN
4 mg | INTRAVENOUS | 0 refills | Status: DC | PRN
Start: 2024-02-04 — End: 2024-02-05

## 2024-02-04 MED ORDER — ENOXAPARIN 40 MG/0.4 ML SC SYRG
40 mg | Freq: Every day | SUBCUTANEOUS | 0 refills | Status: DC
Start: 2024-02-04 — End: 2024-02-05

## 2024-02-04 MED ORDER — ENOXAPARIN 40 MG/0.4 ML SC SYRG
40 mg | Freq: Every day | SUBCUTANEOUS | 0 refills | 7.00000 days | Status: AC
Start: 2024-02-04 — End: ?

## 2024-02-04 MED ORDER — BISACODYL 10 MG RE SUPP
10 mg | Freq: Every day | RECTAL | 0 refills | Status: DC | PRN
Start: 2024-02-04 — End: 2024-02-05

## 2024-02-04 MED ORDER — NALOXONE 4 MG/ACTUATION NA SPRY
NASAL | 0 refills | 1.00000 days | Status: AC
Start: 2024-02-04 — End: ?

## 2024-02-04 MED ORDER — OXYCODONE 5 MG PO TAB
5 mg | ORAL | 0 refills | Status: DC | PRN
Start: 2024-02-04 — End: 2024-02-04
  Administered 2024-02-04: 21:00:00 5 mg via ORAL

## 2024-02-04 NOTE — Procedure - Immed Post [[id number]]
 Brief Operative Note    Name: Paul [name] is a 38 y.o. 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, unspecified fracture morphology, unspecified portion of femur, subsequent encounter [S72.92XK]    Procedure(s) (LRB):  REPAIR, FRACTURE NONUNION OR MALUNION, FEMUR, USING ILIAC OR OTHER BONE GRAFT (Left)    Surgeons and Role:     * Heddings, Sherron LABOR, MD - Primary     * Gleen Mallie SAUNDERS, PA-C - Assisting     * Alvenia Collar, MD - Resident - Assisting      Findings:  Left femur nonunion     Estimated Blood Loss: 400 ml    Specimen(s) Removed/Disposition:   ID Type Source Tests Collected by Time Destination   A : left femur screw Hardware Femur, Left CULTURE-ANAEROBIC, CULTURE-WOUND/TISSUE/FLUID(AEROBIC ONLY)W/SENSITIVITY, CULTURE-FUNGAL,OTHER Heddings, Archie A, MD 02/04/2024 1108    B : Left nonunion culture Tissue Femur, Left CULTURE-ANAEROBIC, CULTURE-WOUND/TISSUE/FLUID(AEROBIC ONLY)W/SENSITIVITY, CULTURE-FUNGAL,OTHER Heddings, Archie A, MD 02/04/2024 1134        Complications:  None      Implants:   Implant Name Serial No. Manufacturer Lot No. LRB No. Used Action   FILLER BONE VOID .JENNI GREGERY LYELL COREAN GLENWOOD [id number] [employer] NA Left 1 Implanted   T2 Alpha Femur Retrograde System 10x268mm NA  [id number] Left 1 Implanted   SCREW BONE  T2 ALPHA LOCK STERILE - SNA NA [employer] [id number] Left 1 Implanted   SCREW BONE  T2 ALPHA LOCK STERILE - SNA NA [employer] [id number] Left 1 Implanted   SCREW BONE  T2 ALPHA LOCK STERILE - SNA NA [employer] [id number] Left 1 Implanted   Strker IMN Screw locking 5x32mm NA  [id number] Left 1 Implanted   FILLER BONE VOID .JENNI GREGERY LYELL COREAN GLENWOOD [id number] [employer] NA Left 1 Implanted       Drains: Details on drains available on LDA report    Disposition:  PACU - stable    Mallie SAUNDERS Gleen, PA-C  Pager

## 2024-02-04 NOTE — Progress Notes [1]
 RT Adult Assessment Note    NAME:Paul [name]             MRN: [id number]             DOB:05/13/1985          AGE: 38 y.o.  ADMISSION DATE: 02/04/2024             DAYS ADMITTED: LOS: 0 days    Additional Comments:  Impressions of the patient: No distress noted.  Intervention(s)/outcome(s): See RT treatment plan. Pt is refusing OSA.  Patient education that was completed: NA  Recommendations to the care team: NA    Vital Signs:  Pulse: 106  RR: 18 PER MINUTE  SpO2: 98 %  O2 Device: None (Room air)  Liter Flow: 2 Lpm  O2%:      Breath Sounds:   Breath Sounds WDL: Within Defined Limits  Respiratory Effort:   Respiratory WDL: Within Defined Limits  Comments: Refuses CPAP/CPOX

## 2024-02-04 NOTE — Progress Notes [1]
Report given to nurse receiving patient on the unit. Patient is alert and oriented, and in no acute distress. Vitals are stable. IV patent. Patient transported up to unit by staff via bed.

## 2024-02-04 NOTE — Progress Notes [1]
 Entered room to answer pt's call light. Pt exclaiming he would not be spending the night in the hospital. I was told I could probably go home after my surgery, so I'm not fucking staying here. You're not going to do anything different here than I can fucking do at home, so I'm not staying. Inquired what pt's concern was with spending the night in the hospital - pt stated he doesn't want to be alone in [facility] (family is in [place]), wasn't planning to stay in the hospital, and, In my eyes, you're just getting more money out of me by me staying. Explained to pt that primary RN, Kip, was working to connect with providers for additional insight on spending the night. Provided education on pain mgmt, bowel regimen post-op, and concern for pt's elevated BP. It's fucking elevated because I don't want to fucking be here! pt stated. Pt stated father (at bedside) will leave ~1830, so, You have 30 mins to figure this out before I sign those papers and leave. Explained pt does have choice to leave, but it would be against medical advice (for concerns listed above). Updated primary RN who is in the process of contacting Surg-Ortho-Trauma team. Administered meds in the meantime and offered menu. Pt stated, I won't even be here long enough to order food, so don't even bring one.
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
## 2024-02-05 ENCOUNTER — Encounter: Admit: 2024-02-05 | Discharge: 2024-02-05 | Payer: MEDICARE

## 2024-02-05 ENCOUNTER — Inpatient Hospital Stay: Admit: 2024-01-10 | Discharge: 2024-01-10 | Payer: MEDICARE

## 2024-02-05 DIAGNOSIS — Z8261 Family history of arthritis: Secondary | ICD-10-CM

## 2024-02-05 DIAGNOSIS — I10 Essential (primary) hypertension: Secondary | ICD-10-CM

## 2024-02-05 DIAGNOSIS — F17210 Nicotine dependence, cigarettes, uncomplicated: Secondary | ICD-10-CM

## 2024-02-05 DIAGNOSIS — Z79899 Other long term (current) drug therapy: Secondary | ICD-10-CM

## 2024-02-05 DIAGNOSIS — E66811 Obesity, class 1: Secondary | ICD-10-CM

## 2024-02-05 DIAGNOSIS — Z800 Family history of malignant neoplasm of digestive organs: Secondary | ICD-10-CM

## 2024-02-05 DIAGNOSIS — Z808 Family history of malignant neoplasm of other organs or systems: Secondary | ICD-10-CM

## 2024-02-05 DIAGNOSIS — Z803 Family history of malignant neoplasm of breast: Secondary | ICD-10-CM

## 2024-02-05 DIAGNOSIS — Z833 Family history of diabetes mellitus: Secondary | ICD-10-CM

## 2024-02-05 DIAGNOSIS — S72352K Displaced comminuted fracture of shaft of left femur, subsequent encounter for closed fracture with nonunion: Principal | ICD-10-CM

## 2024-02-05 NOTE — Telephone Encounter [36]
 Received VM from patient with questions regarding aftercare/restrictions post-op yesterday. Left AMA.     Called patient back at primary number; name & DOB verified. Educated pt that he is WBAT, and to leave surgical dressing in place until POV. Scheduled POV with Mallie Lenz on 02/26/24 for wound check. Patient reported getting a muscle relaxer prescription but not getting pain medication prescription. Let patient know that he was prescribed tramadol, which is not a muscle relaxer, but an opioid pain medication, and he can pick that up at his pharmacy at any time. Educated patient that he is to d/c suboxone while taking tramadol. Patient stated understanding.

## 2024-02-06 ENCOUNTER — Encounter: Admit: 2024-02-06 | Discharge: 2024-02-06 | Payer: MEDICARE
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
## 2024-02-07 NOTE — Operative Report (Direct Entry) [[id number]]
 OPERATIVE REPORT    Name: Paul [name] is a 39 y.o. 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  Removal of previously placed deep orthopedic implants.  Revision of left intertrochanteric femoral fixation to prophylax against proximal periprosthetic femur fracture at junction of retrograde nail and dynamic hip screw      Description and Findings of Operative Procedure: Pt was prepped and draped in sterile fashion.  An incision was made over the distal interlocks. The vastus lateralis was split and bone removed over the heads of the screws with an osteotome.  The screws were removed.  The patellar tendon was split longitudinally.  A guide pin was inserted into the nail through the distal femur.  An entry reamer was used to ream away the bone covering the nail.  Thde extraction device was inserted in the nail.  An incision was made over the proximal interlocks, one was able to be removed.  One was already broken segmentally in the femoral canal, and one the screw head came off leaving the shaft behind.  A femoral distractor was applied and used to bring the femur out to length.  This was done after the nonunion site was approached through a lateral approach.  The nonunion site was taken down with scalpels and currettes.  The bone ends were feathered with an osteotome back to healthy bleeding bone.  A 3.48mm plate was used to hold the femur out to length with screws that did not enter the canal.  A ball tipped guide-wire was placed and then RIA was performed obtaining copius amounts of bone graft.  The RIA reamer was broke in the canal and removed with some fragments of the reamer neck having to remain.  The nail was inserted and interlocks placed distally.  Proximally, where we removed one screw from the dynamic hip screw plate when we inserted our nail, we ended the medial to lateral interlock at this hole.  This ensured we were back to our original length as we used the same length nail.  It also allowed us  to place an interlock from medial to lateral proximally joining the [facility] and retrograde femoral nail construct thereby minimizing the risk for a proximal periprosthetic fracture.  We placed a anterior-posterior proximal interlock in addition to the aforementioned one.  Autogenous bone graft was then packed around the nonunion site mixed with allograft and the wounds were closed and dressed in standard fashion.     Estimated Blood Loss:  400 ml    Specimen(s) Removed/Disposition:   ID Type Source Tests Collected by Time Destination   A : left femur screw Hardware Femur, Left CULTURE-ANAEROBIC, CULTURE-WOUND/TISSUE/FLUID(AEROBIC ONLY)W/SENSITIVITY, CULTURE-FUNGAL,OTHER Matsuko Kretz A, MD 02/04/2024 1108    B : Left nonunion culture Tissue Femur, Left  CULTURE-ANAEROBIC, CULTURE-WOUND/TISSUE/FLUID(AEROBIC ONLY)W/SENSITIVITY, CULTURE-FUNGAL,OTHER Kumiko Fishman A, MD 02/04/2024 1134        Attestation: I performed this procedure with a resident.    Complications:  None      Implants:   Implant Name Serial No. Manufacturer Lot No. LRB No. Used Action   FILLER BONE VOID .JENNI GREGERY LYELL [employer]AN [employer] [id number] [employer] NA Left 1 Implanted   T2 Alpha Femur Retrograde System 10x275mm NA [employer] [id number] Left 1 Implanted   SCREW BONE  T2 ALPHA LOCK STERILE - SNA NA [employer] [id number] Left 1 Implanted   SCREW BONE  T2 ALPHA LOCK STERILE - SNA NA [employer] [id number] Left 1 Implanted   SCREW BONE  T2 ALPHA LOCK STERILE - SNA NA [employer] [id number] Left 1 Implanted   Strker IMN Screw locking 5x75mm NA [employer] [id number] Left 1 Implanted   FILLER BONE VOID .JENNI GREGERY LYELL [employer] [id number] [employer] NA Left 1 Implanted       Drains: Details on drains available on LDA report    Disposition:  PACU - stable          Sherron DELENA Arista, MD  Pager [phone]

## 2024-02-09 LAB — CULTURE-WOUND/TISSUE/FLUID(AEROBIC ONLY)W/SENSITIVITY
